# Patient Record
Sex: Female | Born: 1975 | Race: White | Hispanic: No | Marital: Married | Smoking: Never smoker
Health system: Southern US, Community
[De-identification: ages and names within clinical notes are randomized; demographics above are authoritative.]

## PROBLEM LIST (undated history)

## (undated) DIAGNOSIS — G40909 Epilepsy, unspecified, not intractable, without status epilepticus: Secondary | ICD-10-CM

## (undated) DIAGNOSIS — J45909 Unspecified asthma, uncomplicated: Secondary | ICD-10-CM

## (undated) DIAGNOSIS — G5600 Carpal tunnel syndrome, unspecified upper limb: Secondary | ICD-10-CM

## (undated) HISTORY — PX: OTHER SURGICAL HISTORY: SHX169

## (undated) HISTORY — PX: TONSILLECTOMY: SUR1361

## (undated) HISTORY — DX: Epilepsy, unspecified, not intractable, without status epilepticus: G40.909

## (undated) HISTORY — DX: Carpal tunnel syndrome, unspecified upper limb: G56.00

---

## 2000-07-24 DIAGNOSIS — Z8774 Personal history of (corrected) congenital malformations of heart and circulatory system: Secondary | ICD-10-CM

## 2000-07-24 HISTORY — DX: Personal history of (corrected) congenital malformations of heart and circulatory system: Z87.74

## 2000-07-24 HISTORY — PX: OTHER SURGICAL HISTORY: SHX169

## 2015-07-03 ENCOUNTER — Emergency Department (HOSPITAL_COMMUNITY): Payer: 59

## 2015-07-03 ENCOUNTER — Emergency Department (HOSPITAL_COMMUNITY)
Admission: EM | Admit: 2015-07-03 | Discharge: 2015-07-03 | Disposition: A | Discharge status: Home / Self Care | Payer: 59 | Attending: Emergency Medicine | Admitting: Emergency Medicine

## 2015-07-03 ENCOUNTER — Encounter (HOSPITAL_COMMUNITY): Payer: Self-pay

## 2015-07-03 DIAGNOSIS — Z79899 Other long term (current) drug therapy: Secondary | ICD-10-CM | POA: Diagnosis not present

## 2015-07-03 DIAGNOSIS — R05 Cough: Secondary | ICD-10-CM | POA: Diagnosis present

## 2015-07-03 DIAGNOSIS — J45901 Unspecified asthma with (acute) exacerbation: Secondary | ICD-10-CM | POA: Insufficient documentation

## 2015-07-03 HISTORY — DX: Unspecified asthma, uncomplicated: J45.909

## 2015-07-03 MED ORDER — ALBUTEROL SULFATE (2.5 MG/3ML) 0.083% IN NEBU
5.0000 mg | INHALATION_SOLUTION | Freq: Once | RESPIRATORY_TRACT | Status: AC
  Administered 2015-07-03: 5 mg via RESPIRATORY_TRACT
  Filled 2015-07-03: qty 6

## 2015-07-03 MED ORDER — PREDNISONE 20 MG PO TABS: ORAL_TABLET | ORAL | Status: DC

## 2015-07-03 MED ORDER — PREDNISONE 10 MG PO TABS
60.0000 mg | ORAL_TABLET | Freq: Once | ORAL | Status: AC
  Administered 2015-07-03: 60 mg via ORAL
  Filled 2015-07-03: qty 1

## 2015-07-03 MED ORDER — IPRATROPIUM BROMIDE 0.02 % IN SOLN
0.5000 mg | Freq: Once | RESPIRATORY_TRACT | Status: AC
  Administered 2015-07-03: 0.5 mg via RESPIRATORY_TRACT
  Filled 2015-07-03: qty 2.5

## 2015-07-03 MED ORDER — AEROCHAMBER Z-STAT PLUS/MEDIUM MISC: 1.0000 | Freq: Once | Status: DC

## 2015-07-03 MED ORDER — ALBUTEROL SULFATE HFA 108 (90 BASE) MCG/ACT IN AERS: 2.0000 | INHALATION_SPRAY | RESPIRATORY_TRACT | Status: AC | PRN

## 2015-07-03 NOTE — ED Provider Notes (Signed)
CSN: [id number]     Arrival date & time 07/03/15  [id number] History   First MD Initiated Contact with Patient 07/03/15 0535    Chief Complaint  Patient presents with  . Wheezing     (Consider location/radiation/quality/duration/timing/severity/associated sxs/prior Treatment) HPI patient reports a history of asthma since she was a child. She relates her last flareup was about 3 years ago. She states she's never had to be admitted to the hospital. She states they usually do put her on steroids. She states last night she started feeling short of breath and she took her allergy medication which usually helps with her symptoms. However she woke up at 4 AM and felt short of breath and states her chest felt tight. She has had a mild cough for the past week with fever earlier in the week. She's had rhinorrhea that she described as white and clear. She denies any sore throat.   PCP [facility]  Past Medical History  Diagnosis Date  . Asthma    History reviewed. No pertinent past surgical history. No family history on file. Social History  Substance Use Topics  . Smoking status: Never Smoker   . Smokeless tobacco: None  . Alcohol Use: No   employed  OB History    No data available     Review of Systems  All other systems reviewed and are negative.     Allergies  Review of patient's allergies indicates no known allergies.  Home Medications   Prior to Admission medications   Medication Sig Start Date End Date Taking? Authorizing Provider  Multiple Vitamins-Minerals (MULTIVITAMIN WITH MINERALS) tablet Take 1 tablet by mouth daily.   Yes Historical Provider, MD  albuterol (PROVENTIL HFA;VENTOLIN HFA) 108 (90 BASE) MCG/ACT inhaler Inhale 2 puffs into the lungs every 4 (four) hours as needed. 07/03/15   Devoria AlbeIva Olimpia Tinch, MD  predniSONE (DELTASONE) 20 MG tablet Take 3 po QD x 3d , then 2 po QD x 3d then 1 po QD x 3d 07/03/15   Devoria AlbeIva Namrata Dangler, MD   BP 142/97 mmHg  Pulse 88  Temp(Src) 97.8 F  (36.6 C) (Oral)  Resp 22  Ht 5' 4.5" (1.638 m)  Wt 166 lb (75.297 kg)  BMI 28.06 kg/m2  SpO2 90%  LMP 07/02/2015  Vital signs normal   Physical Exam  Constitutional: She is oriented to person, place, and time. She appears well-developed and well-nourished.  Non-toxic appearance. She does not appear ill. No distress.  HENT:  Head: Normocephalic and atraumatic.  Right Ear: External ear normal.  Left Ear: External ear normal.  Nose: Nose normal. No mucosal edema or rhinorrhea.  Mouth/Throat: Oropharynx is clear and moist and mucous membranes are normal. No dental abscesses or uvula swelling.  Eyes: Conjunctivae and EOM are normal. Pupils are equal, round, and reactive to light.  Neck: Normal range of motion and full passive range of motion without pain. Neck supple.  Cardiovascular: Normal rate, regular rhythm and normal heart sounds.  Exam reveals no gallop and no friction rub.   No murmur heard. Pulmonary/Chest: Effort normal. No respiratory distress. She has wheezes. She has no rhonchi. She has no rales. She exhibits no tenderness and no crepitus.  Patient is having and expiratory wheezing diffusely.  Abdominal: Soft. Normal appearance and bowel sounds are normal. She exhibits no distension. There is no tenderness. There is no rebound and no guarding.  Musculoskeletal: Normal range of motion. She exhibits no edema or tenderness.  Moves all extremities well.  Neurological: She is alert and oriented to person, place, and time. She has normal strength. No cranial nerve deficit.  Skin: Skin is warm, dry and intact. No rash noted. No erythema. No pallor.  Psychiatric: She has a normal mood and affect. Her speech is normal and behavior is normal. Her mood appears not anxious.  Nursing note and vitals reviewed.   ED Course  Procedures (including critical care time)  Medications  aerochamber Z-Stat Plus/medium 1 each (not administered)  albuterol (PROVENTIL) (2.5 MG/3ML) 0.083%  nebulizer solution 5 mg (5 mg Nebulization Given 07/03/15 0553)  ipratropium (ATROVENT) nebulizer solution 0.5 mg (0.5 mg Nebulization Given 07/03/15 0553)  predniSONE (DELTASONE) tablet 60 mg (60 mg Oral Given 07/03/15 0554)   Patient was given one albuterol/Atrovent nebulizer treatment with prednisone 60 mg orally.  Patient was rechecked at 6:50 AM. Her lungs are improved with improved air movement and no wheezing at this point. Patient states she's feeling much improved. We discussed her cough however she is only had clear or white sputum and she states she only had a low-grade fever earlier in the week. We decided to not use steroids. She states normally the inhaler and the prednisone will improve her symptoms.    Imaging Review Dg Chest 2 View  07/03/2015  CLINICAL DATA:  Acute onset of wheezing and shortness of breath. Cough, body aches and intermittent fever. Initial encounter. EXAM: CHEST  2 VIEW COMPARISON:  None. FINDINGS: The lungs are well-aerated and clear. There is no evidence of focal opacification, pleural effusion or pneumothorax. The heart is normal in size; the mediastinal contour is within normal limits. Postoperative change is noted at the superior mediastinum. No acute osseous abnormalities are seen. IMPRESSION: No acute cardiopulmonary process seen. Electronically Signed   By: Roanna Raider M.D.   On: 07/03/2015 06:33   I have personally reviewed and evaluated these images and lab results as part of my medical decision-making.   EKG Interpretation None      MDM   Final diagnoses:  Exacerbation of asthma    New Prescriptions   ALBUTEROL (PROVENTIL HFA;VENTOLIN HFA) 108 (90 BASE) MCG/ACT INHALER    Inhale 2 puffs into the lungs every 4 (four) hours as needed.   PREDNISONE (DELTASONE) 20 MG TABLET    Take 3 po QD x 3d , then 2 po QD x 3d then 1 po QD x 3d    Plan discharge  Devoria Albe, MD, Concha Pyo, MD 07/03/15 0700

## 2015-07-03 NOTE — ED Notes (Signed)
Wheezing onset yesterday, states has gotten worse overnight, has been out of her inhaler.

## 2015-07-03 NOTE — Discharge Instructions (Signed)
Use the inhaler for wheezing and shortness of breath. Take the prednisone until gone. Recheck if you struggle to breathe despite using the inhaler, get a fever or seem worse.    Metered Dose Inhaler With Spacer Inhaled medicines are the basis of treatment of asthma and other breathing problems. Inhaled medicine can only be effective if used properly. Good technique assures that the medicine reaches the lungs. Your health care provider has asked you to use a spacer with your inhaler to help you take the medicine more effectively. A spacer is a plastic tube with a mouthpiece on one end and an opening that connects to the inhaler on the other end. Metered dose inhalers (MDIs) are used to deliver a variety of inhaled medicines. These include quick relief or rescue medicines (such as bronchodilators) and controller medicines (such as corticosteroids). The medicine is delivered by pushing down on a metal canister to release a set amount of spray. If you are using different kinds of inhalers, use your quick relief medicine to open the airways 10-15 minutes before using a steroid if instructed to do so by your health care provider. If you are unsure which inhalers to use and the order of using them, ask your health care provider, nurse, or respiratory therapist. HOW TO USE THE INHALER WITH A SPACER  Remove cap from inhaler.  If you are using the inhaler for the first time, you will need to prime it. Shake the inhaler for 5 seconds and release four puffs into the air, away from your face. Ask your health care provider or pharmacist if you have questions about priming your inhaler.  Shake inhaler for 5 seconds before each breath in (inhalation).  Place the open end of the spacer onto the mouthpiece of the inhaler.  Position the inhaler so that the top of the canister faces up and the spacer mouthpiece faces you.  Put your index finger on the top of the medicine canister. Your thumb supports the bottom of  the inhaler and the spacer.  Breathe out (exhale) normally and as completely as possible.  Immediately after exhaling, place the spacer between your teeth and into your mouth. Close your mouth tightly around the spacer.  Press the canister down with the index finger to release the medicine.  At the same time as the canister is pressed, inhale deeply and slowly until the lungs are completely filled. This should take 4-6 seconds. Keep your tongue down and out of the way.  Hold the medicine in your lungs for 5-10 seconds (10 seconds is best). This helps the medicine get into the small airways of your lungs. Exhale.  Repeat inhaling deeply through the spacer mouthpiece. Again hold that breath for up to 10 seconds (10 seconds is best). Exhale slowly. If it is difficult to take this second deep breath through the spacer, breathe normally several times through the spacer. Remove the spacer from your mouth.  Wait at least 15-30 seconds between puffs. Continue with the above steps until you have taken the number of puffs your health care provider has ordered. Do not use the inhaler more than your health care provider directs you to.  Remove spacer from the inhaler and place cap on inhaler.  Follow the directions from your health care provider or the inhaler insert for cleaning the inhaler and spacer. If you are using a steroid inhaler, rinse your mouth with water after your last puff, gargle, and spit out the water. Do not swallow the water.  AVOID:  Inhaling before or after starting the spray of medicine. It takes practice to coordinate your breathing with triggering the spray.  Inhaling through the nose (rather than the mouth) when triggering the spray. HOW TO DETERMINE IF YOUR INHALER IS FULL OR NEARLY EMPTY You cannot know when an inhaler is empty by shaking it. A few inhalers are now being made with dose counters. Ask your health care provider for a prescription that has a dose counter if you  feel you need that extra help. If your inhaler does not have a counter, ask your health care provider to help you determine the date you need to refill your inhaler. Write the refill date on a calendar or your inhaler canister. Refill your inhaler 7-10 days before it runs out. Be sure to keep an adequate supply of medicine. This includes making sure it is not expired, and you have a spare inhaler.  SEEK MEDICAL CARE IF:   Symptoms are only partially relieved with your inhaler.  You are having trouble using your inhaler.  You experience some increase in phlegm. SEEK IMMEDIATE MEDICAL CARE IF:   You feel little or no relief with your inhalers. You are still wheezing and are feeling shortness of breath or tightness in your chest or both.  You have dizziness, headaches, or fast heart rate.  You have chills, fever, or night sweats.  There is a noticeable increase in phlegm production, or there is blood in the phlegm.   This information is not intended to replace advice given to you by your health care provider. Make sure you discuss any questions you have with your health care provider.   Document Released: 07/10/2005 Document Revised: 11/24/2014 Document Reviewed: 12/26/2012 Elsevier Interactive Patient Education 2016 Elsevier Inc.  Asthma, Acute Bronchospasm Acute bronchospasm caused by asthma is also referred to as an asthma attack. Bronchospasm means your air passages become narrowed. The narrowing is caused by inflammation and tightening of the muscles in the air tubes (bronchi) in your lungs. This can make it hard to breathe or cause you to wheeze and cough. CAUSES Possible triggers are:  Animal dander from the skin, hair, or feathers of animals.  Dust mites contained in house dust.  Cockroaches.  Pollen from trees or grass.  Mold.  Cigarette or tobacco smoke.  Air pollutants such as dust, household cleaners, hair sprays, aerosol sprays, paint fumes, strong chemicals, or  strong odors.  Cold air or weather changes. Cold air may trigger inflammation. Winds increase molds and pollens in the air.  Strong emotions such as crying or laughing hard.  Stress.  Certain medicines such as aspirin or beta-blockers.  Sulfites in foods and drinks, such as dried fruits and wine.  Infections or inflammatory conditions, such as a flu, cold, or inflammation of the nasal membranes (rhinitis).  Gastroesophageal reflux disease (GERD). GERD is a condition where stomach acid backs up into your esophagus.  Exercise or strenuous activity. SIGNS AND SYMPTOMS   Wheezing.  Excessive coughing, particularly at night.  Chest tightness.  Shortness of breath. DIAGNOSIS  Your health care provider will ask you about your medical history and perform a physical exam. A chest X-ray or blood testing may be performed to look for other causes of your symptoms or other conditions that may have triggered your asthma attack. TREATMENT  Treatment is aimed at reducing inflammation and opening up the airways in your lungs. Most asthma attacks are treated with inhaled medicines. These include quick relief or rescue medicines (  such as bronchodilators) and controller medicines (such as inhaled corticosteroids). These medicines are sometimes given through an inhaler or a nebulizer. Systemic steroid medicine taken by mouth or given through an IV tube also can be used to reduce the inflammation when an attack is moderate or severe. Antibiotic medicines are only used if a bacterial infection is present.  HOME CARE INSTRUCTIONS   Rest.  Drink plenty of liquids. This helps the mucus to remain thin and be easily coughed up. Only use caffeine in moderation and do not use alcohol until you have recovered from your illness.  Do not smoke. Avoid being exposed to secondhand smoke.  You play a critical role in keeping yourself in good health. Avoid exposure to things that cause you to wheeze or to have  breathing problems.  Keep your medicines up-to-date and available. Carefully follow your health care provider's treatment plan.  Take your medicine exactly as prescribed.  When pollen or pollution is bad, keep windows closed and use an air conditioner or go to places with air conditioning.  Asthma requires careful medical care. See your health care provider for a follow-up as advised. If you are more than [redacted] weeks pregnant and you were prescribed any new medicines, let your obstetrician know about the visit and how you are doing. Follow up with your health care provider as directed.  After you have recovered from your asthma attack, make an appointment with your outpatient doctor to talk about ways to reduce the likelihood of future attacks. If you do not have a doctor who manages your asthma, make an appointment with a primary care doctor to discuss your asthma. SEEK IMMEDIATE MEDICAL CARE IF:   You are getting worse.  You have trouble breathing. If severe, call your local emergency services (911 in the [place]  You develop chest pain or discomfort.  You are vomiting.  You are not able to keep fluids down.  You are coughing up yellow, green, brown, or bloody sputum.  You have a fever and your symptoms suddenly get worse.  You have trouble swallowing. MAKE SURE YOU:   Understand these instructions.  Will watch your condition.  Will get help right away if you are not doing well or get worse.   This information is not intended to replace advice given to you by your health care provider. Make sure you discuss any questions you have with your health care provider.   Document Released: 10/25/2006 Document Revised: 07/15/2013 Document Reviewed: 01/15/2013 Elsevier Interactive Patient Education [employer]

## 2015-07-03 NOTE — Progress Notes (Signed)
**Note De-Identified Faithann Natal Obfuscation** Patient is asthmatic and has prescription for MDI; RT instructed patient on spacer.

## 2016-04-27 ENCOUNTER — Other Ambulatory Visit: Payer: Self-pay | Admitting: Family Medicine

## 2016-04-27 DIAGNOSIS — Z1231 Encounter for screening mammogram for malignant neoplasm of breast: Secondary | ICD-10-CM

## 2019-07-01 ENCOUNTER — Other Ambulatory Visit (HOSPITAL_COMMUNITY): Payer: Self-pay | Admitting: Family Medicine

## 2019-07-01 DIAGNOSIS — Z1231 Encounter for screening mammogram for malignant neoplasm of breast: Secondary | ICD-10-CM

## 2019-10-01 ENCOUNTER — Other Ambulatory Visit: Payer: Self-pay

## 2019-10-01 ENCOUNTER — Ambulatory Visit (HOSPITAL_COMMUNITY)
Admission: RE | Admit: 2019-10-01 | Discharge: 2019-10-01 | Disposition: A | Discharge status: Home / Self Care | Payer: BC Managed Care – PPO | Source: Ambulatory Visit | Attending: Family Medicine | Admitting: Family Medicine

## 2019-10-01 DIAGNOSIS — Z1231 Encounter for screening mammogram for malignant neoplasm of breast: Secondary | ICD-10-CM | POA: Diagnosis present

## 2019-10-02 ENCOUNTER — Other Ambulatory Visit (HOSPITAL_COMMUNITY): Payer: Self-pay | Admitting: Family Medicine

## 2019-10-02 DIAGNOSIS — R928 Other abnormal and inconclusive findings on diagnostic imaging of breast: Secondary | ICD-10-CM

## 2019-10-07 ENCOUNTER — Telehealth: Payer: Self-pay | Admitting: Adult Health

## 2019-10-07 NOTE — Telephone Encounter (Signed)

## 2019-10-08 ENCOUNTER — Encounter: Payer: Self-pay | Admitting: Adult Health

## 2019-10-08 ENCOUNTER — Other Ambulatory Visit: Payer: Self-pay

## 2019-10-08 ENCOUNTER — Ambulatory Visit (INDEPENDENT_AMBULATORY_CARE_PROVIDER_SITE_OTHER): Payer: BC Managed Care – PPO | Admitting: Adult Health

## 2019-10-08 VITALS — BP 121/77 | HR 107 | Ht 64.0 in | Wt 158.4 lb

## 2019-10-08 DIAGNOSIS — N951 Menopausal and female climacteric states: Secondary | ICD-10-CM | POA: Diagnosis not present

## 2019-10-08 DIAGNOSIS — R61 Generalized hyperhidrosis: Secondary | ICD-10-CM

## 2019-10-08 DIAGNOSIS — N926 Irregular menstruation, unspecified: Secondary | ICD-10-CM | POA: Diagnosis not present

## 2019-10-08 DIAGNOSIS — N921 Excessive and frequent menstruation with irregular cycle: Secondary | ICD-10-CM

## 2019-10-08 DIAGNOSIS — N949 Unspecified condition associated with female genital organs and menstrual cycle: Secondary | ICD-10-CM

## 2019-10-08 LAB — POCT HEMOGLOBIN: Hemoglobin: 14 g/dL (ref 11–14.6)

## 2019-10-08 NOTE — Patient Instructions (Signed)

## 2019-10-08 NOTE — Progress Notes (Signed)
Patient ID: Melissa Skinner, female   DOB: July 21, 1976, 44 y.o.   MRN: [id number] History of Present Illness:  Melissa Skinner is a 44 year old white female,married, G3P2, in for irregular bleeding. Has been bleeding this time since 2/28, light. 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  PCP is Terie Purser PA.  Current Medications, Allergies, Past Medical History, Past Surgical History, Family History and Social History were reviewed in [employer] record.     Review of Systems: For last several cycles, will bleed 2-3 weeks, is light, some pressure and discomfort +night sweats No pain with sex, uses condoms  Had cramps last year and increased calcium and magnesium and is better   Physical Exam:BP 121/77 (BP Location: Left Arm, Patient Position: Sitting, Cuff Size: Normal)   Pulse (!) 107   Ht 5\' 4"  (1.626 m)   Wt 158 lb 6.4 oz (71.8 kg)   LMP 09/21/2019   BMI 27.19 kg/m HGB 14 General:  Well developed, well nourished, no acute distress Skin:  Warm and dry Lungs; Clear to auscultation bilaterally Cardiovascular: Regular rate and rhythm Pelvic:  External genitalia is normal in appearance, no lesions.  The vagina is normal in appearance,+[ink blood, no odor. Urethra has no lesions or masses. The cervix is smooth,but nabothian cyst at 12 0'clock.  Uterus is felt to be ULNS and mildly tender.  No adnexal masses,+LLQ tenderness noted.Bladder is non tender, no masses felt. Extremities/musculoskeletal:  No swelling or varicosities noted, no clubbing or cyanosis Psych:  No mood changes, alert and cooperative,seems happy Fall risk is low PHQ 2 score is 0. Examination chaperoned by 09/23/2019 CMA.  Impression and Plan: 1. Menorrhagia with irregular cycle  2. Night sweats  3. Perimenopause Discussed peri menopause  Review handout   4. Tenderness of uterus GYN Ishmael Holter in 6 days   5. Irregular bleeding GYN [place] to assess uterus and ovaries  Will  talk when [place] back about options, did touch on HRT today

## 2019-10-13 ENCOUNTER — Telehealth: Payer: Self-pay | Admitting: Obstetrics & Gynecology

## 2019-10-13 NOTE — Telephone Encounter (Signed)

## 2019-10-14 ENCOUNTER — Ambulatory Visit (INDEPENDENT_AMBULATORY_CARE_PROVIDER_SITE_OTHER): Payer: BC Managed Care – PPO

## 2019-10-14 ENCOUNTER — Other Ambulatory Visit: Payer: Self-pay

## 2019-10-14 ENCOUNTER — Ambulatory Visit (HOSPITAL_COMMUNITY)
Admission: RE | Admit: 2019-10-14 | Discharge: 2019-10-14 | Disposition: A | Discharge status: Home / Self Care | Payer: BC Managed Care – PPO | Source: Ambulatory Visit | Attending: Family Medicine | Admitting: Family Medicine

## 2019-10-14 DIAGNOSIS — R928 Other abnormal and inconclusive findings on diagnostic imaging of breast: Secondary | ICD-10-CM

## 2019-10-14 DIAGNOSIS — N840 Polyp of corpus uteri: Secondary | ICD-10-CM

## 2019-10-14 DIAGNOSIS — N949 Unspecified condition associated with female genital organs and menstrual cycle: Secondary | ICD-10-CM

## 2019-10-14 DIAGNOSIS — N926 Irregular menstruation, unspecified: Secondary | ICD-10-CM

## 2019-10-14 NOTE — Progress Notes (Signed)
PELVIC US TA/TV: homogeneous anteverted uterus,wnl,EEC 7.3 mm,echogenic endometrial mass with feeder vessel 12 x 9 x 6 mm,normal ovaries,ovaries appear mobile,no free fluid,no pain during ultrasound  Chaperone Peggy

## 2019-10-16 ENCOUNTER — Telehealth: Payer: Self-pay | Admitting: Adult Health

## 2019-10-16 DIAGNOSIS — N926 Irregular menstruation, unspecified: Secondary | ICD-10-CM

## 2019-10-16 NOTE — Telephone Encounter (Signed)
Left message to call about US  

## 2019-10-16 NOTE — Telephone Encounter (Signed)
Melissa Skinner is made aware that US showed endometrial polyp and Dr Emelda Fear said it needs to be removed, will make appt with him  The uterus and ovaries looked normal

## 2019-10-22 ENCOUNTER — Telehealth: Payer: Self-pay | Admitting: Obstetrics and Gynecology

## 2019-10-22 NOTE — Telephone Encounter (Signed)

## 2019-10-27 ENCOUNTER — Other Ambulatory Visit: Payer: Self-pay

## 2019-10-27 ENCOUNTER — Ambulatory Visit (INDEPENDENT_AMBULATORY_CARE_PROVIDER_SITE_OTHER): Payer: BC Managed Care – PPO | Admitting: Obstetrics and Gynecology

## 2019-10-27 ENCOUNTER — Encounter: Payer: Self-pay | Admitting: Obstetrics and Gynecology

## 2019-10-27 VITALS — BP 135/87 | HR 78 | Ht 64.2 in | Wt 161.6 lb

## 2019-10-27 DIAGNOSIS — N840 Polyp of corpus uteri: Secondary | ICD-10-CM | POA: Diagnosis not present

## 2019-10-27 DIAGNOSIS — Z3202 Encounter for pregnancy test, result negative: Secondary | ICD-10-CM | POA: Diagnosis not present

## 2019-10-27 LAB — POCT URINE PREGNANCY: Preg Test, Ur: NEGATIVE

## 2019-10-27 MED ORDER — NORETHIN ACE-ETH ESTRAD-FE 1-20 MG-MCG(24) PO TABS: 1.0000 | ORAL_TABLET | Freq: Every day | ORAL | 3 refills | Status: DC

## 2019-10-27 NOTE — Progress Notes (Signed)
Patient ID: Melissa Skinner, female   DOB: 03-Jun-1976, 44 y.o.   MRN: [id number]    [facility] Visit  10/27/19     Patient name: Melissa Skinner MRN [id number]  Date of birth: [birth year]  CC & HPI:  Melissa Skinner is a 44 y.o. female presenting today for probable endometrial polyp discussion.   She notes that her period would be extremely heavy for 5-6 days, and then it would last around 14 days. She continues to have spotting.   10/14/2019 TV pelvic ultrasound revealed homogeneous anteverted uterus, wnl, EEC 7.3 mm, echogenic endometrial mass with feeder vessel 12 x 9 x 6 mm, normal ovaries, ovaries appear mobile, no free fluid, no pain during ultrasound.   Last PAP was December 2020 with PCP.   Melissa Skinner notes that she has no intention of having anymore children. Her current birth control method is condoms.   She notes that she is having significant hot flashes; these seem to have some correlation to her menstrual cycle, but she is unsure. She is having increased fatigue and more difficulty losing weight.   ROS:  Review of Systems  Constitutional: Positive for malaise/fatigue (mild). Negative for diaphoresis, fever and weight loss.  HENT: Negative for congestion and sore throat.   Respiratory: Negative for cough and shortness of breath.   Cardiovascular: Negative for chest pain, palpitations and leg swelling.  Gastrointestinal: Negative for constipation, diarrhea, nausea and vomiting.  Genitourinary: Negative for frequency and urgency.  Musculoskeletal: Negative for myalgias.  Skin: Negative for rash.  Neurological: Negative for dizziness, weakness and headaches.  Psychiatric/Behavioral: Negative for depression. The patient is not nervous/anxious.      Pertinent History Reviewed:   Reviewed: Last PAP December 2020 Medical         Past Medical History:  Diagnosis Date  . Asthma                               Surgical Hx:   No past surgical history on file. Medications: Reviewed &  Updated - see associated section                       Current Outpatient Medications:  .  albuterol (PROVENTIL HFA;VENTOLIN HFA) 108 (90 BASE) MCG/ACT inhaler, Inhale 2 puffs into the lungs every 4 (four) hours as needed., Disp: 6.7 g, Rfl: 0 .  Multiple Vitamins-Minerals (MULTIVITAMIN WITH MINERALS) tablet, Take 1 tablet by mouth daily., Disp: , Rfl:    Social History: Reviewed -  reports that she has never smoked. She has never used smokeless tobacco. She does not have any known family history of breast cancer.   Objective Findings:  Vitals: There were no vitals taken for this visit.  PHYSICAL EXAMINATION General appearance - alert, well appearing, and in no distress, oriented to person, place, and time, normal appearing weight and well hydrated Mental status - alert, oriented to person, place, and time, normal mood, behavior, speech, dress, motor activity, and thought processes, affect appropriate to mood Chest - not examined Heart - not examined Abdomen - not examined Breasts - not examined Skin - normal coloration and turgor, no rashes, no suspicious skin lesions noted  PELVIC External genitalia - normal  Vulva - normal  Vagina - good support Cervix - Multiparous, well supported, anteverted, anteflexed.  Uterus - well supported. No fullness in ovary area. Adnexa - normal  Wet [place] - n/a Rectal -  Assessment & Plan:   Assessment:  1.   Endometrial Ablation Discussion: Discussed with pt risks and benefits of endometrial ablation. Advised pt that intrauterine pregnancy is nearly 100% preventable with ablation. Discussed reduced risk of ectopic pregnancy and reduced risk of ovarian cancer from 1 in 100 to 1 in 300 with bilateral salpingectomy in a lengthy conversation with risks benefits rationale and alternatives including IUD reviewed. Brochures given.   At end of discussion, pt had opportunity to ask questions and has no further questions at this time.   Specific  discussion of endometrial ablation and bilateral salpingectomy as noted above. Greater than 50% was spent in counseling and coordination of care with the patient.   Total time greater than: 10 minutes.     2.   Tubal Ligation & Salpingectomy Discussion  Discussed with pt risks and benefits of BTL. Discussed using clips only vs bilateral salpingectomy. Advised pt that bilateral salpingectomy is a permanent procedure, but reduces the risk of cancer by 2/3.  At end of discussion, pt had opportunity to ask questions and has no further questions at this time.   Specific discussion of permanent sterilization as noted above. Greater than 50% was spent in counseling and coordination of care with the patient.   Total time greater than: 5 minutes.    3. Birth Control Discussion  Birth control options discussed: Reviewed all forms of birth control options available including abstinence; over the counter/barrier methods; depo, nuva ring, Nexplanon, IUD, OCP and patch.   Risks/benefits/side effects of each discussed. Questions were answered.    Plan:  1. Rx Lo Loestrin 1:20  2. Follow up in 3 months via telehealth    By signing my name below, I, [employer], attest that this documentation has been prepared under the direction and in the presence of Tilda Burrow, MD. Electronically Signed: Mal Misty Medical Scribe. 10/27/19. 9:24 AM.  I personally performed the services described in this documentation, which was SCRIBED in my presence. The recorded information has been reviewed and considered accurate. It has been edited as necessary during review. Tilda Burrow, MD

## 2020-01-28 ENCOUNTER — Telehealth: Payer: BC Managed Care – PPO | Admitting: Obstetrics and Gynecology

## 2020-02-04 ENCOUNTER — Encounter: Payer: Self-pay | Admitting: Obstetrics and Gynecology

## 2020-02-04 ENCOUNTER — Ambulatory Visit (INDEPENDENT_AMBULATORY_CARE_PROVIDER_SITE_OTHER): Payer: BC Managed Care – PPO | Admitting: Obstetrics and Gynecology

## 2020-02-04 ENCOUNTER — Other Ambulatory Visit: Payer: Self-pay

## 2020-02-04 VITALS — BP 146/86 | HR 70 | Ht 65.0 in | Wt 160.0 lb

## 2020-02-04 DIAGNOSIS — N921 Excessive and frequent menstruation with irregular cycle: Secondary | ICD-10-CM | POA: Diagnosis not present

## 2020-02-04 DIAGNOSIS — N926 Irregular menstruation, unspecified: Secondary | ICD-10-CM

## 2020-02-04 NOTE — Progress Notes (Signed)
Patient ID: Melissa Skinner, female   DOB: [birth year], 44 y.o.   MRN: [id number]    [facility] Visit  @DATE @            Patient name: Melissa Skinner MRN Melissa Skinner  Date of birth: 12-20-1975  CC & HPI:  Melissa Skinner is a 44 y.o. female presenting today for a follow up for her heavy periods. She reports that her periods have been lighter and only last a few days since starting Loestrin.1/20 Her mood has improved and she has lost a little bit of weight. She does have a little bit of spotting every once in a while. She notes that she missed a pill and had heavy bleeding for a few days. The patient denies fever, chills or any other symptoms or complaints at this time.   ROS:  ROS  + occasional spotting + weight loss - fever - chills All systems are negative except as noted in the HPI and PMH.   Pertinent History Reviewed:   Reviewed: Medical         Past Medical History:  Diagnosis Date   Asthma    Carpal tunnel syndrome                               Surgical Hx:    Past Surgical History:  Procedure Laterality Date   heart catherization     TONSILLECTOMY     Medications: Reviewed & Updated - see associated section                       Current Outpatient Medications:    albuterol (PROVENTIL HFA;VENTOLIN HFA) 108 (90 BASE) MCG/ACT inhaler, Inhale 2 puffs into the lungs every 4 (four) hours as needed., Disp: 6.7 g, Rfl: 0   cetirizine (ZYRTEC) 10 MG tablet, Take 10 mg by mouth daily., Disp: , Rfl:    MAGNESIUM CL-CALCIUM CARBONATE PO, Take by mouth., Disp: , Rfl:    Multiple Vitamins-Minerals (MULTIVITAMIN WITH MINERALS) tablet, Take 1 tablet by mouth daily., Disp: , Rfl:    Norethindrone Acetate-Ethinyl Estrad-FE (LOESTRIN 24 FE) 1-20 MG-MCG(24) tablet, Take 1 tablet by mouth daily., Disp: 3 Package, Rfl: 3   Probiotic Product (PROBIOTIC-10 PO), Take by mouth., Disp: , Rfl:    Social History: Reviewed -  reports that she has never smoked. She has never used  smokeless tobacco.  Objective Findings:  Vitals: Blood pressure (!) 146/86, pulse 70, height 5\' 5"  (1.651 m), weight 160 lb (72.6 kg), last menstrual period 01/18/2020.  PHYSICAL EXAMINATION General appearance - alert, well appearing, and in no distress, oriented to person, place, and time and overweight Mental status - alert, oriented to person, place, and time, normal mood, behavior, speech, dress, motor activity, and thought processes, affect appropriate to mood  PELVIC DEFERRED  Assessment & Plan:   A:  1.  Menorrhagia, improved with Loestrin1/20   P:  1.  Refill Loestrin for 1 year 2.  F/U in 1 year  By signing my name below, I, , attest that this documentation has been prepared under the direction and in the presence of 01/20/2020, MD. Electronically Signed: Pietro Cassis, Medical Scribe. 02/04/20. 3:46 PM.  I personally performed the services described in this documentation, which was SCRIBED in my presence. The recorded information has been reviewed and considered accurate. It has been edited as necessary during review. Pietro Cassis, MD

## 2020-06-07 ENCOUNTER — Other Ambulatory Visit: Payer: BC Managed Care – PPO

## 2020-06-07 DIAGNOSIS — Z20822 Contact with and (suspected) exposure to covid-19: Secondary | ICD-10-CM

## 2020-06-08 LAB — NOVEL CORONAVIRUS, NAA: SARS-CoV-2, NAA: NOT DETECTED

## 2020-06-08 LAB — SARS-COV-2, NAA 2 DAY TAT

## 2020-06-22 ENCOUNTER — Encounter: Payer: Self-pay | Admitting: Adult Health

## 2020-06-22 ENCOUNTER — Other Ambulatory Visit: Payer: Self-pay

## 2020-06-22 ENCOUNTER — Ambulatory Visit: Payer: BC Managed Care – PPO | Admitting: Adult Health

## 2020-06-22 VITALS — BP 158/95 | HR 82 | Ht 65.0 in | Wt 155.0 lb

## 2020-06-22 DIAGNOSIS — Z3041 Encounter for surveillance of contraceptive pills: Secondary | ICD-10-CM | POA: Diagnosis not present

## 2020-06-22 DIAGNOSIS — I10 Essential (primary) hypertension: Secondary | ICD-10-CM

## 2020-06-22 DIAGNOSIS — N926 Irregular menstruation, unspecified: Secondary | ICD-10-CM | POA: Diagnosis not present

## 2020-06-22 MED ORDER — LO LOESTRIN FE 1 MG-10 MCG / 10 MCG PO TABS: 1.0000 | ORAL_TABLET | Freq: Every day | ORAL | 11 refills | Status: DC

## 2020-06-22 MED ORDER — HYDROCHLOROTHIAZIDE 12.5 MG PO CAPS: 12.5000 mg | ORAL_CAPSULE | Freq: Every day | ORAL | 3 refills | Status: DC

## 2020-06-22 NOTE — Progress Notes (Signed)
  Subjective:     Patient ID: Melissa Skinner, female   DOB: 15-Dec-1975, 44 y.o.   MRN: [id number]  HPI Melissa Skinner is a 44 year old white female,married, [phone] in complaining of having bleeding for 12 days last month with night sweats and moody, this last period was normal. She is seeing Dr Gerilyn Pilgrim and getting MRI in December, has been dizzy and had trouble with words and walking. PCP is Terie Purser PA.   Review of Systems  +irregular bleeding +night sweats +moody  Reviewed past medical,surgical, social and family history. Reviewed medications and allergies.     Objective:   Physical Exam BP (!) 158/95 (BP Location: Right Arm, Patient Position: Sitting, Cuff Size: Normal)   Pulse 82   Ht 5\' 5"  (1.651 m)   Wt 155 lb (70.3 kg)   LMP 06/14/2020   BMI 25.79 kg/m  Skin warm and dry.  Lungs: clear to ausculation bilaterally. Cardiovascular: regular rate and rhythm.   Fall risk is low  Upstream - 06/22/20 1636      Pregnancy Intention Screening   Does the patient want to become pregnant in the next year? No    Does the patient's partner want to become pregnant in the next year? No    Would the patient like to discuss contraceptive options today? No      Contraception Wrap Up   Current Method Oral Contraceptive    End Method Oral Contraceptive    Contraception Counseling Provided No          Assessment:     1. Irregular bleeding Will switch OCs to lo Loestrin given 3 packs, start today use condoms  Meds ordered this encounter  Medications  . hydrochlorothiazide (MICROZIDE) 12.5 MG capsule    Sig: Take 1 capsule (12.5 mg total) by mouth daily.    Dispense:  30 capsule    Refill:  3    Order Specific Question:   Supervising Provider    Answer:   06/24/20, LUTHER H [[id number]]  . Norethindrone-Ethinyl Estradiol-Fe Biphas (LO LOESTRIN FE) 1 MG-10 MCG / 10 MCG tablet    Sig: Take 1 tablet by mouth daily. Take 1 daily by mouth    Dispense:  28 tablet    Refill:  11    BIN Despina Hidden, PCN  CN, GRP [id number]    Order Specific Question:   Supervising Provider    Answer:   [id number] H [[id number]]    2. Hypertension, unspecified type Will rx Microzide   3. Encounter for surveillance of contraceptive pills     Plan:     Follow up in 8 weeks for BP check and ROS

## 2020-08-06 ENCOUNTER — Other Ambulatory Visit: Payer: Self-pay | Admitting: Neurology

## 2020-08-06 ENCOUNTER — Other Ambulatory Visit (HOSPITAL_COMMUNITY): Payer: BC Managed Care – PPO | Admitting: Neurology

## 2020-08-06 DIAGNOSIS — R569 Unspecified convulsions: Secondary | ICD-10-CM

## 2020-08-17 ENCOUNTER — Other Ambulatory Visit: Payer: Self-pay

## 2020-08-17 ENCOUNTER — Encounter: Payer: Self-pay | Admitting: Adult Health

## 2020-08-17 ENCOUNTER — Ambulatory Visit (INDEPENDENT_AMBULATORY_CARE_PROVIDER_SITE_OTHER): Payer: BC Managed Care – PPO | Admitting: Adult Health

## 2020-08-17 VITALS — BP 138/91 | HR 72 | Ht 65.0 in | Wt 153.0 lb

## 2020-08-17 DIAGNOSIS — N951 Menopausal and female climacteric states: Secondary | ICD-10-CM | POA: Diagnosis not present

## 2020-08-17 DIAGNOSIS — I10 Essential (primary) hypertension: Secondary | ICD-10-CM

## 2020-08-17 DIAGNOSIS — Z3041 Encounter for surveillance of contraceptive pills: Secondary | ICD-10-CM | POA: Diagnosis not present

## 2020-08-17 MED ORDER — HYDROCHLOROTHIAZIDE 12.5 MG PO CAPS: 12.5000 mg | ORAL_CAPSULE | Freq: Every day | ORAL | 6 refills | Status: DC

## 2020-08-17 MED ORDER — LO LOESTRIN FE 1 MG-10 MCG / 10 MCG PO TABS: 1.0000 | ORAL_TABLET | Freq: Every day | ORAL | 11 refills | Status: DC

## 2020-08-17 NOTE — Progress Notes (Signed)
  Subjective:     Patient ID: Melissa Skinner, female   DOB: 10-01-1975, 45 y.o.   MRN: [id number]  HPI Melissa Skinner is a 45 year old white female,married, [id number] back in follow up on starting lo Loestrin and Microzide.Periods are much lighter, no Hot flashes and not as moody. She has had EEG and had seizure after it and is on keppra now. PCP is Melissa Purser PA.  Review of Systems Periods lighter No hot flashes Not as moody Reviewed past medical,surgical, social and family history. Reviewed medications and allergies.     Objective:   Physical Exam BP (!) 138/91 (BP Location: Right Arm, Patient Position: Sitting, Cuff Size: Normal)   Pulse 72   Ht 5\' 5"  (1.651 m)   Wt 153 lb (69.4 kg)   LMP 08/17/2020   BMI 25.46 kg/m Skin warm and dry. Lungs: clear to ausculation bilaterally. Cardiovascular: regular rate and rhythm. Fall risk is low  Upstream - 08/17/20 1625      Pregnancy Intention Screening   Does the patient want to become pregnant in the next year? No    Does the patient's partner want to become pregnant in the next year? No    Would the patient like to discuss contraceptive options today? No      Contraception Wrap Up   Current Method Oral Contraceptive    End Method Oral Contraceptive    Contraception Counseling Provided No             Assessment:     1. Encounter for surveillance of contraceptive pills Continue lo loestrin, I gave 3 packs and discount card  Meds ordered this encounter  Medications  . Norethindrone-Ethinyl Estradiol-Fe Biphas (LO LOESTRIN FE) 1 MG-10 MCG / 10 MCG tablet    Sig: Take 1 tablet by mouth daily. Take 1 daily by mouth    Dispense:  28 tablet    Refill:  11    BIN 08/19/20, PCN CN, GRP [id number]    Order Specific Question:   Supervising Provider    Answer:   [id number] H [[id number]]  . hydrochlorothiazide (MICROZIDE) 12.5 MG capsule    Sig: Take 1 capsule (12.5 mg total) by mouth daily.    Dispense:  30 capsule    Refill:  6     Order Specific Question:   Supervising Provider    Answer:   Duane Lope, LUTHER H [[id number]]    2. Hypertension, unspecified type Continue Microzide   3. Perimenopause     Plan:     Follow up in 6 months

## 2020-08-30 ENCOUNTER — Ambulatory Visit (HOSPITAL_COMMUNITY): Payer: BC Managed Care – PPO

## 2020-08-30 ENCOUNTER — Encounter (HOSPITAL_COMMUNITY): Payer: Self-pay

## 2020-11-22 ENCOUNTER — Other Ambulatory Visit: Payer: Self-pay

## 2020-11-22 ENCOUNTER — Encounter (HOSPITAL_COMMUNITY): Payer: Self-pay

## 2020-11-22 ENCOUNTER — Ambulatory Visit (HOSPITAL_COMMUNITY)
Admission: RE | Admit: 2020-11-22 | Discharge: 2020-11-22 | Disposition: A | Discharge status: Home / Self Care | Payer: BC Managed Care – PPO | Source: Ambulatory Visit | Attending: Neurology | Admitting: Neurology

## 2020-11-22 ENCOUNTER — Ambulatory Visit (HOSPITAL_COMMUNITY): Payer: BC Managed Care – PPO

## 2020-11-22 DIAGNOSIS — R569 Unspecified convulsions: Secondary | ICD-10-CM

## 2020-12-01 ENCOUNTER — Ambulatory Visit (HOSPITAL_COMMUNITY): Admission: RE | Admit: 2020-12-01 | Payer: BC Managed Care – PPO | Source: Ambulatory Visit

## 2020-12-15 ENCOUNTER — Other Ambulatory Visit: Payer: Self-pay | Admitting: Adult Health

## 2021-02-24 ENCOUNTER — Ambulatory Visit (HOSPITAL_COMMUNITY)
Admission: RE | Admit: 2021-02-24 | Discharge: 2021-02-24 | Disposition: A | Discharge status: Home / Self Care | Payer: BC Managed Care – PPO | Source: Ambulatory Visit | Attending: Neurology | Admitting: Neurology

## 2021-02-24 ENCOUNTER — Other Ambulatory Visit: Payer: Self-pay

## 2021-02-24 DIAGNOSIS — R569 Unspecified convulsions: Secondary | ICD-10-CM | POA: Insufficient documentation

## 2021-02-24 MED ORDER — GADOBUTROL 1 MMOL/ML IV SOLN
7.0000 mL | Freq: Once | INTRAVENOUS | Status: AC | PRN
  Administered 2021-02-24: 7 mL via INTRAVENOUS

## 2021-08-13 ENCOUNTER — Other Ambulatory Visit: Payer: Self-pay | Admitting: Adult Health

## 2021-09-04 IMAGING — MG DIGITAL SCREENING BILAT W/ TOMO W/ CAD
8 series · 9 of 24 positions shown · non-contrast
Comparison: Previous exam(s).

CLINICAL DATA: Screening.

EXAM:
DIGITAL SCREENING BILATERAL MAMMOGRAM WITH TOMO AND CAD

[L CC synth-2D]
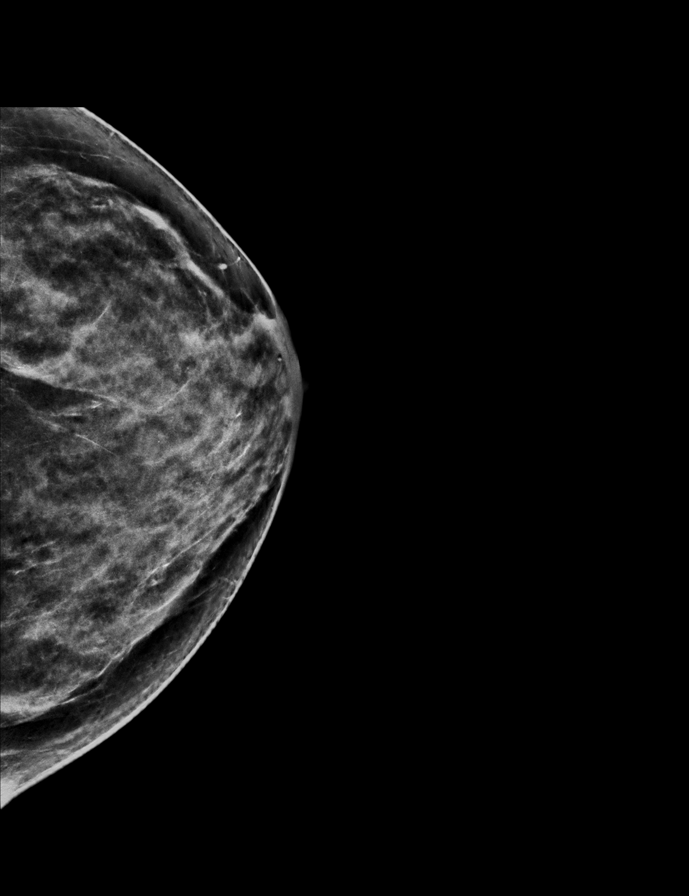

[R MLO synth-2D]
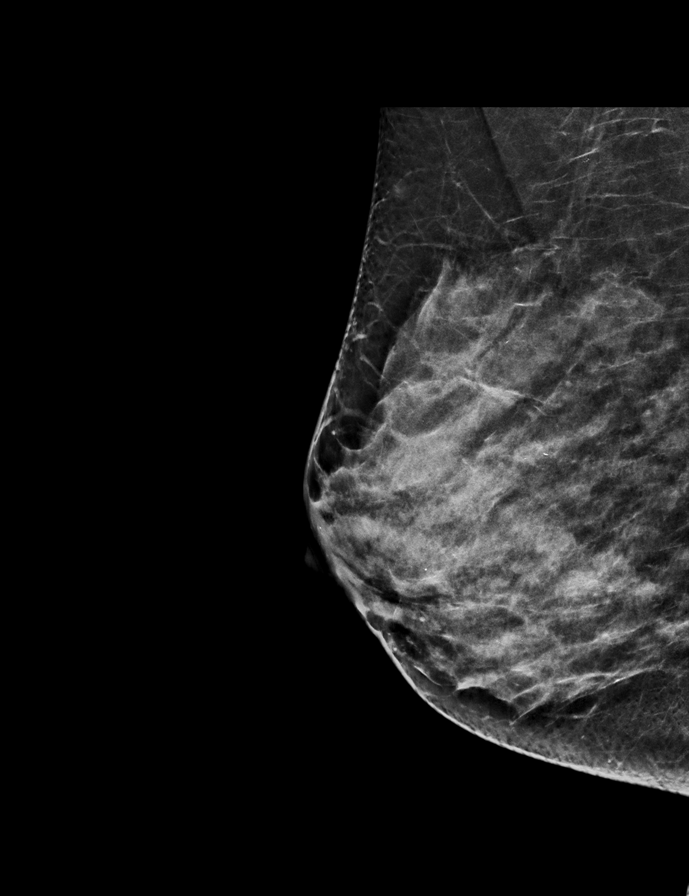

[L MLO synth-2D]
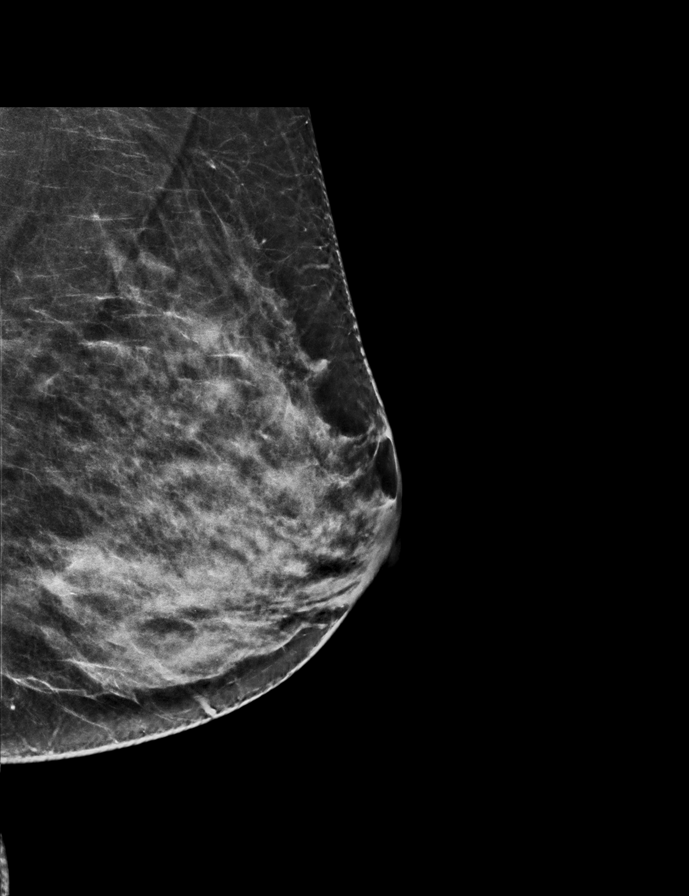

[R CC synth-2D]
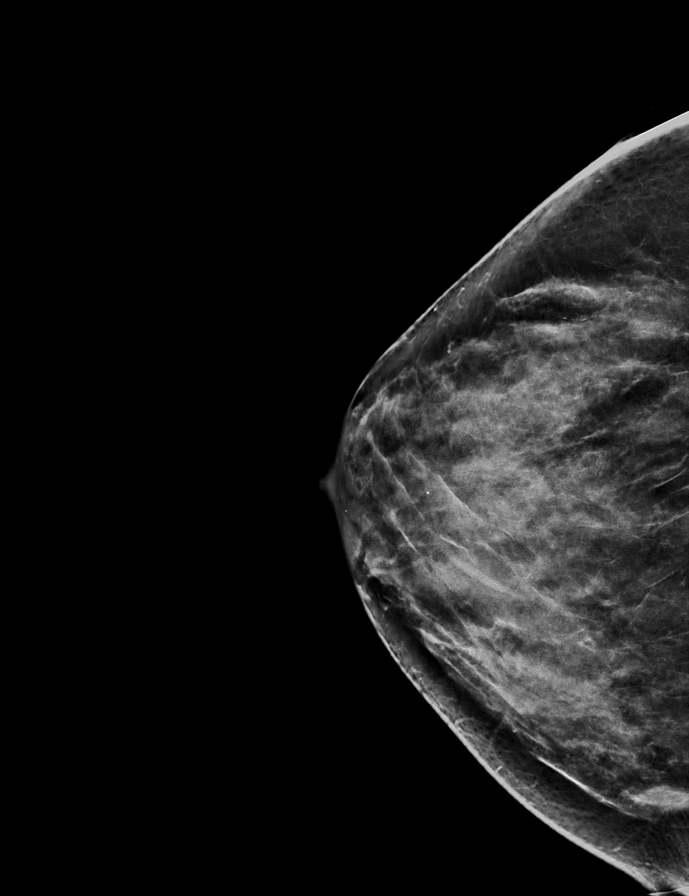

[L MLO tomo · 2 of 66 frames shown]
[frame 22/66]
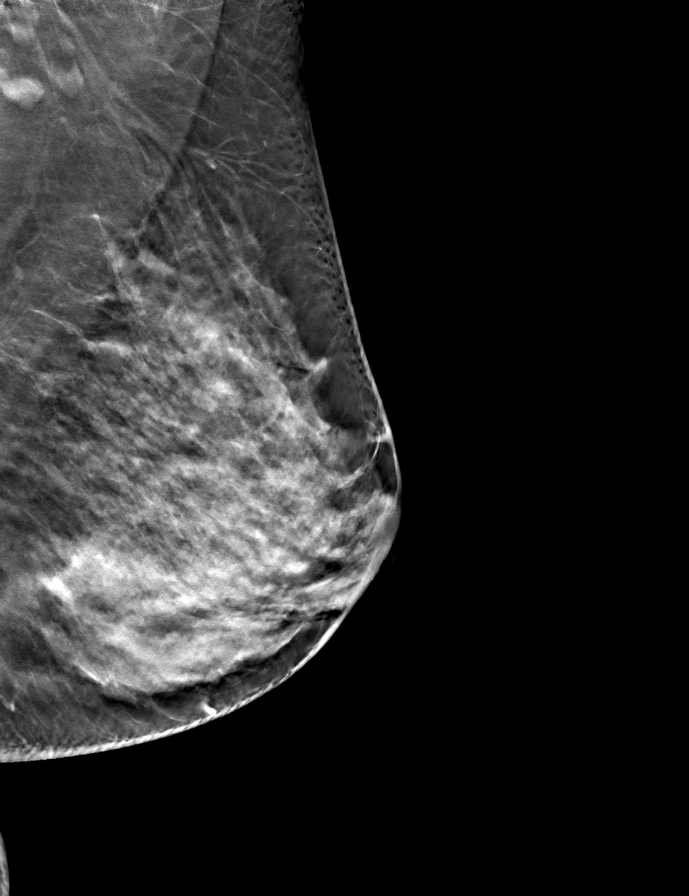
[frame 33/66]
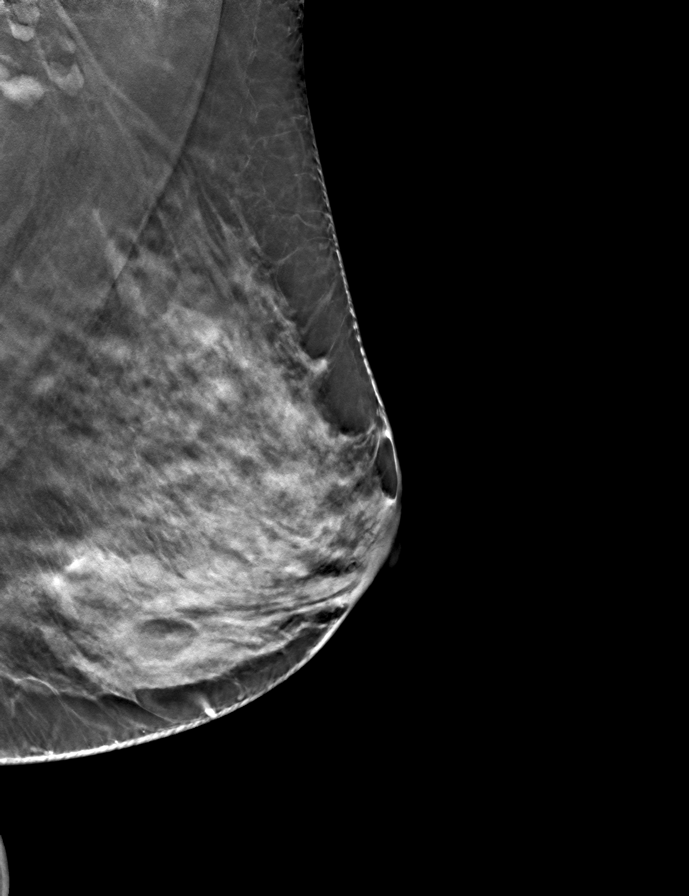

[L CC tomo · tomo slice 31/60.0]
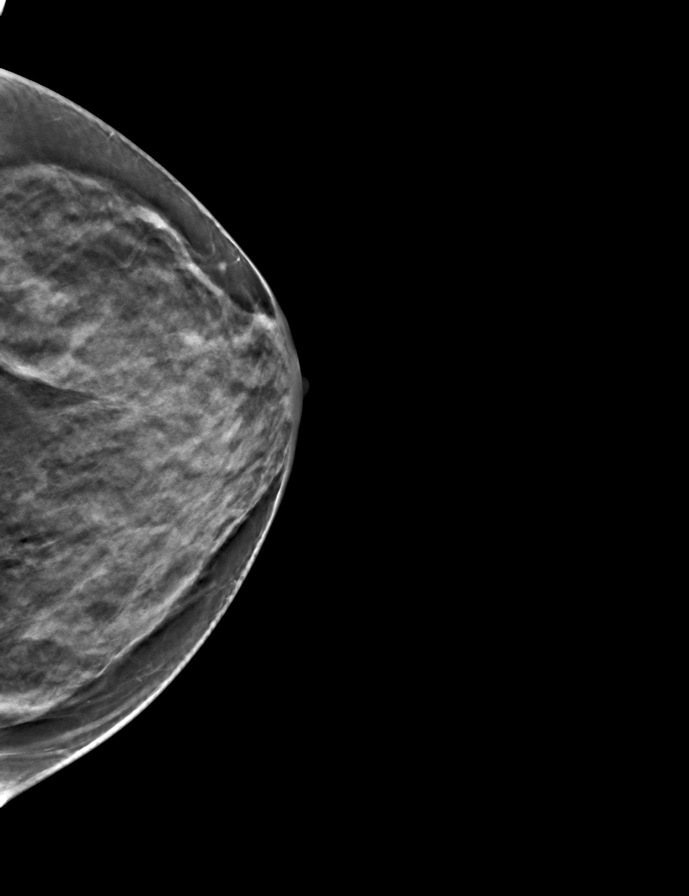

[R MLO tomo · tomo slice 31/61.0]
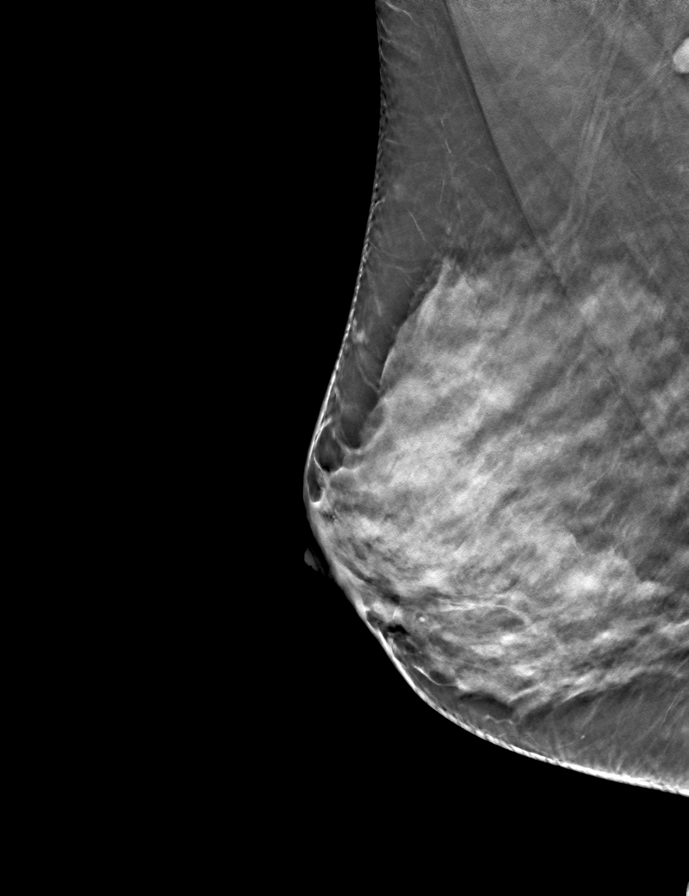

[R CC tomo · tomo slice 31/62.0]
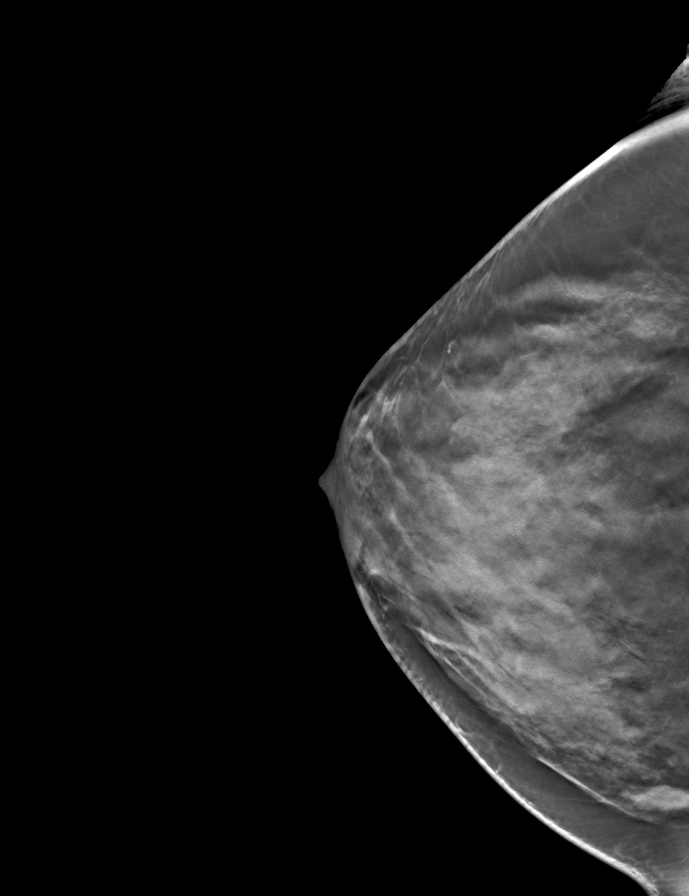

[9 of 24 positions shown; findings below may reference images not displayed]

ACR Breast Density Category c: The breast tissue is heterogeneously
dense, which may obscure small masses.
FINDINGS: In the right breast, a possible asymmetry warrants further
evaluation. In the left breast, no findings suspicious for
malignancy. Images were processed with CAD.
IMPRESSION: Further evaluation is suggested for possible asymmetry in the right
breast.

RECOMMENDATION:
Diagnostic mammogram and possibly ultrasound of the right breast.
(Code:EU-[id number])

The patient will be contacted regarding the findings, and additional
imaging will be scheduled.

BI-RADS CATEGORY  0: Incomplete. Need additional imaging evaluation
and/or prior mammograms for comparison.

## 2021-10-07 ENCOUNTER — Other Ambulatory Visit: Payer: Self-pay

## 2021-10-07 ENCOUNTER — Other Ambulatory Visit (HOSPITAL_COMMUNITY): Payer: Self-pay | Admitting: Family Medicine

## 2021-10-07 ENCOUNTER — Ambulatory Visit (HOSPITAL_COMMUNITY)
Admission: RE | Admit: 2021-10-07 | Discharge: 2021-10-07 | Disposition: A | Discharge status: Home / Self Care | Payer: BC Managed Care – PPO | Source: Ambulatory Visit | Attending: Family Medicine | Admitting: Family Medicine

## 2021-10-07 DIAGNOSIS — K5900 Constipation, unspecified: Secondary | ICD-10-CM | POA: Diagnosis not present

## 2021-10-07 DIAGNOSIS — I10 Essential (primary) hypertension: Secondary | ICD-10-CM | POA: Diagnosis not present

## 2021-10-07 DIAGNOSIS — Z20822 Contact with and (suspected) exposure to covid-19: Secondary | ICD-10-CM | POA: Diagnosis not present

## 2021-10-07 DIAGNOSIS — K810 Acute cholecystitis: Secondary | ICD-10-CM | POA: Diagnosis not present

## 2021-10-07 DIAGNOSIS — R1013 Epigastric pain: Secondary | ICD-10-CM

## 2021-10-07 DIAGNOSIS — E876 Hypokalemia: Secondary | ICD-10-CM | POA: Diagnosis not present

## 2021-10-07 DIAGNOSIS — R109 Unspecified abdominal pain: Secondary | ICD-10-CM | POA: Diagnosis present

## 2021-10-07 DIAGNOSIS — Z79899 Other long term (current) drug therapy: Secondary | ICD-10-CM | POA: Diagnosis not present

## 2021-10-07 DIAGNOSIS — N920 Excessive and frequent menstruation with regular cycle: Secondary | ICD-10-CM | POA: Diagnosis not present

## 2021-10-08 ENCOUNTER — Encounter (HOSPITAL_COMMUNITY): Payer: Self-pay | Admitting: *Deleted

## 2021-10-08 ENCOUNTER — Emergency Department (HOSPITAL_COMMUNITY): Payer: BC Managed Care – PPO

## 2021-10-08 ENCOUNTER — Other Ambulatory Visit: Payer: Self-pay

## 2021-10-08 ENCOUNTER — Observation Stay (HOSPITAL_COMMUNITY)
Admission: EM | Admit: 2021-10-08 | Discharge: 2021-10-11 | Disposition: A | Discharge status: Home / Self Care | Payer: BC Managed Care – PPO | Attending: General Surgery | Admitting: General Surgery

## 2021-10-08 DIAGNOSIS — Z833 Family history of diabetes mellitus: Secondary | ICD-10-CM

## 2021-10-08 DIAGNOSIS — Z91048 Other nonmedicinal substance allergy status: Secondary | ICD-10-CM | POA: Diagnosis not present

## 2021-10-08 DIAGNOSIS — E876 Hypokalemia: Secondary | ICD-10-CM | POA: Diagnosis present

## 2021-10-08 DIAGNOSIS — G40909 Epilepsy, unspecified, not intractable, without status epilepticus: Secondary | ICD-10-CM | POA: Diagnosis present

## 2021-10-08 DIAGNOSIS — Z8261 Family history of arthritis: Secondary | ICD-10-CM | POA: Diagnosis not present

## 2021-10-08 DIAGNOSIS — N921 Excessive and frequent menstruation with irregular cycle: Secondary | ICD-10-CM | POA: Diagnosis present

## 2021-10-08 DIAGNOSIS — J4520 Mild intermittent asthma, uncomplicated: Secondary | ICD-10-CM | POA: Diagnosis present

## 2021-10-08 DIAGNOSIS — R569 Unspecified convulsions: Secondary | ICD-10-CM

## 2021-10-08 DIAGNOSIS — Z20822 Contact with and (suspected) exposure to covid-19: Secondary | ICD-10-CM | POA: Diagnosis present

## 2021-10-08 DIAGNOSIS — N920 Excessive and frequent menstruation with regular cycle: Secondary | ICD-10-CM | POA: Insufficient documentation

## 2021-10-08 DIAGNOSIS — K810 Acute cholecystitis: Principal | ICD-10-CM | POA: Insufficient documentation

## 2021-10-08 DIAGNOSIS — R1010 Upper abdominal pain, unspecified: Principal | ICD-10-CM

## 2021-10-08 DIAGNOSIS — K5900 Constipation, unspecified: Secondary | ICD-10-CM | POA: Diagnosis present

## 2021-10-08 DIAGNOSIS — Z79899 Other long term (current) drug therapy: Secondary | ICD-10-CM | POA: Insufficient documentation

## 2021-10-08 DIAGNOSIS — Z8049 Family history of malignant neoplasm of other genital organs: Secondary | ICD-10-CM

## 2021-10-08 DIAGNOSIS — I10 Essential (primary) hypertension: Secondary | ICD-10-CM | POA: Diagnosis present

## 2021-10-08 DIAGNOSIS — Z8349 Family history of other endocrine, nutritional and metabolic diseases: Secondary | ICD-10-CM

## 2021-10-08 DIAGNOSIS — R102 Pelvic and perineal pain: Secondary | ICD-10-CM | POA: Diagnosis present

## 2021-10-08 DIAGNOSIS — Z803 Family history of malignant neoplasm of breast: Secondary | ICD-10-CM

## 2021-10-08 LAB — MAGNESIUM: Magnesium: 2 mg/dL (ref 1.7–2.4)

## 2021-10-08 LAB — COMPREHENSIVE METABOLIC PANEL
ALT: 17 U/L (ref 0–44)
AST: 19 U/L (ref 15–41)
Albumin: 3.7 g/dL (ref 3.5–5.0)
Alkaline Phosphatase: 49 U/L (ref 38–126)
Anion gap: 10 (ref 5–15)
BUN: 14 mg/dL (ref 6–20)
CO2: 28 mmol/L (ref 22–32)
Calcium: 8.7 mg/dL — ABNORMAL LOW (ref 8.9–10.3)
Chloride: 99 mmol/L (ref 98–111)
Creatinine, Ser: 0.97 mg/dL (ref 0.44–1.00)
GFR, Estimated: >60 mL/min (ref >60)
Glucose, Bld: 135 mg/dL — ABNORMAL HIGH (ref 70–99)
Potassium: 2.8 mmol/L — ABNORMAL LOW (ref 3.5–5.1)
Sodium: 137 mmol/L (ref 135–145)
Total Bilirubin: 0.6 mg/dL (ref 0.3–1.2)
Total Protein: 7.2 g/dL (ref 6.5–8.1)

## 2021-10-08 LAB — CBC
HCT: 40.6 % (ref 36.0–46.0)
Hemoglobin: 13.8 g/dL (ref 12.0–15.0)
MCH: 31 pg (ref 26.0–34.0)
MCHC: 34 g/dL (ref 30.0–36.0)
MCV: 91.2 fL (ref 80.0–100.0)
Platelets: 341 10*3/uL (ref 150–400)
RBC: 4.45 MIL/uL (ref 3.87–5.11)
RDW: 12.4 % (ref 11.5–15.5)
WBC: 12 10*3/uL — ABNORMAL HIGH (ref 4.0–10.5)
nRBC: 0 % (ref 0.0–0.2)

## 2021-10-08 LAB — LIPASE, BLOOD: Lipase: 33 U/L (ref 11–51)

## 2021-10-08 LAB — HCG, QUANTITATIVE, PREGNANCY: hCG, Beta Chain, Quant, S: <1 m[IU]/mL (ref <5)

## 2021-10-08 MED ORDER — HYDRALAZINE HCL 20 MG/ML IJ SOLN
10.0000 mg | Freq: Four times a day (QID) | INTRAMUSCULAR | Status: DC | PRN
Start: 2021-10-08 — End: 2021-10-11

## 2021-10-08 MED ORDER — SODIUM CHLORIDE 0.9 % IV SOLN
2.0000 g | INTRAVENOUS | Status: DC
  Administered 2021-10-08 – 2021-10-10 (×3): 2 g via INTRAVENOUS
  Filled 2021-10-08 (×3): qty 20

## 2021-10-08 MED ORDER — ALBUTEROL SULFATE (2.5 MG/3ML) 0.083% IN NEBU
2.5000 mg | INHALATION_SOLUTION | RESPIRATORY_TRACT | Status: DC | PRN
Start: 2021-10-08 — End: 2021-10-11

## 2021-10-08 MED ORDER — POLYETHYLENE GLYCOL 3350 17 G PO PACK: 17.0000 g | PACK | Freq: Two times a day (BID) | ORAL | Status: DC

## 2021-10-08 MED ORDER — ACETAMINOPHEN 325 MG PO TABS
650.0000 mg | ORAL_TABLET | Freq: Four times a day (QID) | ORAL | Status: DC | PRN
Start: 2021-10-08 — End: 2021-10-11

## 2021-10-08 MED ORDER — BISACODYL 10 MG RE SUPP
10.0000 mg | Freq: Once | RECTAL | Status: AC
  Administered 2021-10-08: 10 mg via RECTAL
  Filled 2021-10-08: qty 1

## 2021-10-08 MED ORDER — BISACODYL 10 MG RE SUPP
10.0000 mg | Freq: Every day | RECTAL | Status: DC | PRN
Start: 2021-10-08 — End: 2021-10-11

## 2021-10-08 MED ORDER — HEPARIN SODIUM (PORCINE) 5000 UNIT/ML IJ SOLN
5000.0000 [IU] | Freq: Three times a day (TID) | INTRAMUSCULAR | Status: DC
  Filled 2021-10-08: qty 1

## 2021-10-08 MED ORDER — LEVETIRACETAM 500 MG PO TABS
1000.0000 mg | ORAL_TABLET | Freq: Every morning | ORAL | Status: DC
  Administered 2021-10-09 – 2021-10-11 (×2): 1000 mg via ORAL
  Filled 2021-10-08 (×2): qty 2

## 2021-10-08 MED ORDER — PANTOPRAZOLE SODIUM 40 MG IV SOLR
40.0000 mg | INTRAVENOUS | Status: DC
  Administered 2021-10-08 – 2021-10-10 (×3): 40 mg via INTRAVENOUS
  Filled 2021-10-08 (×3): qty 10

## 2021-10-08 MED ORDER — SODIUM CHLORIDE 0.9% FLUSH: 3.0000 mL | INTRAVENOUS | Status: DC | PRN

## 2021-10-08 MED ORDER — TRAZODONE HCL 50 MG PO TABS: 50.0000 mg | ORAL_TABLET | Freq: Every evening | ORAL | Status: DC | PRN

## 2021-10-08 MED ORDER — SODIUM CHLORIDE 0.9% FLUSH
3.0000 mL | Freq: Two times a day (BID) | INTRAVENOUS | Status: DC
  Administered 2021-10-09 (×2): 3 mL via INTRAVENOUS

## 2021-10-08 MED ORDER — POTASSIUM CHLORIDE CRYS ER 20 MEQ PO TBCR
40.0000 meq | EXTENDED_RELEASE_TABLET | ORAL | Status: AC
Start: 2021-10-08 — End: 2021-10-09
  Administered 2021-10-08 – 2021-10-09 (×2): 40 meq via ORAL
  Filled 2021-10-08 (×2): qty 2

## 2021-10-08 MED ORDER — KETOROLAC TROMETHAMINE 15 MG/ML IJ SOLN
15.0000 mg | Freq: Four times a day (QID) | INTRAMUSCULAR | Status: DC
  Administered 2021-10-09 (×2): 15 mg via INTRAVENOUS
  Filled 2021-10-08 (×2): qty 1

## 2021-10-08 MED ORDER — POTASSIUM CHLORIDE 10 MEQ/100ML IV SOLN
10.0000 meq | INTRAVENOUS | Status: AC
  Administered 2021-10-08 (×4): 10 meq via INTRAVENOUS
  Filled 2021-10-08: qty 100

## 2021-10-08 MED ORDER — SODIUM CHLORIDE 0.9% FLUSH
3.0000 mL | Freq: Two times a day (BID) | INTRAVENOUS | Status: DC
  Administered 2021-10-09 – 2021-10-11 (×4): 3 mL via INTRAVENOUS

## 2021-10-08 MED ORDER — IOHEXOL 300 MG/ML  SOLN
100.0000 mL | Freq: Once | INTRAMUSCULAR | Status: AC | PRN
  Administered 2021-10-08: 100 mL via INTRAVENOUS

## 2021-10-08 MED ORDER — ACETAMINOPHEN 650 MG RE SUPP: 650.0000 mg | Freq: Four times a day (QID) | RECTAL | Status: DC | PRN

## 2021-10-08 MED ORDER — OXYCODONE HCL 5 MG PO TABS
5.0000 mg | ORAL_TABLET | ORAL | Status: DC | PRN
  Administered 2021-10-09: 5 mg via ORAL
  Filled 2021-10-08: qty 1

## 2021-10-08 MED ORDER — POTASSIUM CHLORIDE 10 MEQ/100ML IV SOLN
10.0000 meq | Freq: Once | INTRAVENOUS | Status: AC
Start: 2021-10-08 — End: 2021-10-08
  Administered 2021-10-08: 10 meq via INTRAVENOUS
  Filled 2021-10-08: qty 100

## 2021-10-08 MED ORDER — SODIUM CHLORIDE 0.9 % IV SOLN: 250.0000 mL | INTRAVENOUS | Status: DC | PRN

## 2021-10-08 MED ORDER — POLYETHYLENE GLYCOL 3350 17 G PO PACK: 17.0000 g | PACK | Freq: Every day | ORAL | Status: DC | PRN

## 2021-10-08 MED ORDER — PANTOPRAZOLE SODIUM 40 MG IV SOLR
40.0000 mg | Freq: Once | INTRAVENOUS | Status: AC
Start: 2021-10-08 — End: 2021-10-08
  Administered 2021-10-08: 40 mg via INTRAVENOUS
  Filled 2021-10-08: qty 10

## 2021-10-08 MED ORDER — LEVETIRACETAM 500 MG PO TABS
500.0000 mg | ORAL_TABLET | Freq: Every evening | ORAL | Status: DC
Start: 2021-10-08 — End: 2021-10-11
  Administered 2021-10-08 – 2021-10-10 (×3): 500 mg via ORAL
  Filled 2021-10-08 (×3): qty 1

## 2021-10-08 MED ORDER — ONDANSETRON HCL 4 MG PO TABS
4.0000 mg | ORAL_TABLET | Freq: Four times a day (QID) | ORAL | Status: DC | PRN
  Administered 2021-10-11: 4 mg via ORAL
  Filled 2021-10-08: qty 1

## 2021-10-08 MED ORDER — POLYETHYLENE GLYCOL 3350 17 G PO PACK
17.0000 g | PACK | Freq: Once | ORAL | Status: AC
  Administered 2021-10-08: 17 g via ORAL
  Filled 2021-10-08: qty 1

## 2021-10-08 MED ORDER — ONDANSETRON HCL 4 MG/2ML IJ SOLN: 4.0000 mg | Freq: Four times a day (QID) | INTRAMUSCULAR | Status: DC | PRN

## 2021-10-08 MED ORDER — KETOROLAC TROMETHAMINE 30 MG/ML IJ SOLN
30.0000 mg | Freq: Once | INTRAMUSCULAR | Status: AC
  Administered 2021-10-08: 30 mg via INTRAVENOUS
  Filled 2021-10-08: qty 1

## 2021-10-08 MED ORDER — DEXTROSE-NACL 5-0.9 % IV SOLN: INTRAVENOUS | Status: DC

## 2021-10-08 NOTE — Plan of Care (Signed)
?  Problem: Education: ?Goal: Knowledge of General Education information will improve ?Description: Including pain rating scale, medication(s)/side effects and non-pharmacologic comfort measures ?10/08/2021 2112 by Karolee Ohs, RN ?Outcome: Progressing ?10/08/2021 2111 by Karolee Ohs, RN ?Outcome: Progressing ?  ?Problem: Health Behavior/Discharge Planning: ?Goal: Ability to manage health-related needs will improve ?10/08/2021 2112 by Karolee Ohs, RN ?Outcome: Progressing ?10/08/2021 2111 by Karolee Ohs, RN ?Outcome: Progressing ?  ?Problem: Clinical Measurements: ?Goal: Will remain free from infection ?Outcome: Progressing ?  ?

## 2021-10-08 NOTE — ED Triage Notes (Signed)
Pt with c/o abd pain since early Thursday morning.  Seen PCP on Thursday.  Prescribed nausea meds and sent here for xray yesterday, pt does not know results.  Pt with continued pain. ?

## 2021-10-08 NOTE — H&P (Signed)
?                                                                                           ? ? ? ? Patient Demographics:  ? ? ?Melissa Skinner, is a 47 y.o. female  MRN: [id number]   DOB - 10-27-1975 ? ?Admit Date - 10/08/2021 ? ?Outpatient Primary MD for the patient is Avis Epley, PA-C ? ? Assessment & Plan:  ? ?Assessment and Plan: ? ?1) acute cholecystitis--- discussed with general surgeon Dr. Henreitta Leber ?-LFTs not elevated ?-Leukocytosis noted ?-Does not meet sepsis criteria per se ?-Repeat CMP in a.m. if LFTs are elevated consider right upper quadrant ultrasound, otherwise no need for ultrasound given CT  AP and clinical exam finding ?-Defer timing of lap chole to general surgeon ?-IV Toradol and p.o. oxycodone for pain control ?-Zofran as needed for nausea ?-iv Rocephin empirically ? ?2) history of seizures----PTA was on p.o. Keppra, changed to IV for now, until oral intake is more reliable ? ?3) hypokalemia--- in the setting of poor oral intake due to #1 above ?-Replace potassium ?- serum mag 2.0 ? ?4)HTN-stable, hold HCTZ given poor oral intake due to #1 above ?- may use IV Hydralazine 10 mg  Every 4 hours Prn for systolic blood pressure over 170 mmhg ? ?5)Menometrorrhagia--- CT suggest uterine fibroids ?-Hold OCP for now ?-Patient is currently having a menstrual flow ?-Check TSH ? ?6) mild intermittent asthma--- no recent flareups ?-May use albuterol as needed ? ?Disposition/Need for in-Hospital Stay- patient unable to be discharged at this time due to ----acute cholecystitis requiring IV antibiotics and IV fluids pending further surgical intervention* ? ?Status is: Inpatient ? ?Dispo: The patient is from: Home ?             Anticipated d/c is to: Home ?             Anticipated d/c date is: 2 days ?             Patient currently is not medically stable to d/c. ?Barriers: Not Clinically Stable-  ? ?With History of - ?Reviewed by  me ? ?Past Medical History:  ?Diagnosis Date  ? Asthma   ? Carpal tunnel syndrome   ? Epilepsy (HCC)   ?   ? ?Past Surgical History:  ?Procedure Laterality Date  ? heart catherization    ? TONSILLECTOMY    ? ? ?Chief Complaint  ?Patient presents with  ? Abdominal Pain  ?  ? ? HPI:  ? ? Melissa Skinner  is a 46 y.o. female with past medical history relevant for menometrorrhagia, mild intermittent asthma, HTN and history of seizures who presents to the ED with concerns about nausea and epigastric as well as right upper quadrant pain since 3 AM on 10/06/2021 ?-On 10/05/2021 around 7 PM patient had hotdogs and chili, with coleslaw----she woke up 2 AM on 10/06/2021 with nausea and abdominal pain initially in the epigastric area then radiated to right upper quadrant area ?-Denies frank emesis ?-Patient has had difficulty with oral intake due to nausea and abdominal pain since then ?-She had abdominal x-rays done on 10/07/2021  with some retained stool otherwise no acute findings ?- ?Presented to the ED on 10/08/2020 persistent symptoms and CT abdomen and pelvis showed--Gallbladder distension, wall thickening and pericholecystic inflammation highly suspicious for acute cholecystitis. Sludge versus gallstones are noted within the gallbladder. No evidence of biliary dilatation. Trace amount of free fluid in the pelvis. ?---Heterogeneous probable fibroid uterus. ?- ?EDP discussed with on-call general surgeon Dr. Henreitta LeberBridges recommended hospitalist admit await further decision regarding timing of lap chole ?-WBCs 12.0 otherwise CBC WNL ?-Potassium is 2.8,--creatinine 0.97 BUN 14 ?-LFTs WNL ?-Lipase is Not elevated ?-Additional history obtained from patient's husband at bedside ?No fever  Or chills  ? ? Review of systems:  ?  ?In addition to the HPI above,  ? ?A full Review of  Systems was done, all other systems reviewed are negative except as noted above in HPI , . ? ? ? Social History:  ?Reviewed by me ? ?  ?Social History  ? ?Tobacco Use   ? Smoking status: Never  ? Smokeless tobacco: Never  ?Substance Use Topics  ? Alcohol use: No  ? ? ? Family History :  ?Reviewed by me ? ?  ?Family History  ?Problem Relation Age of Onset  ? Thyroid disease Mother   ? Macular degeneration Mother   ? Diabetes type I Father   ? Arthritis Father   ? Uterine cancer Cousin   ? Breast cancer Maternal Aunt   ? ? Home Medications:  ? ?Prior to Admission medications   ?Medication Sig Start Date End Date Taking? Authorizing Provider  ?albuterol (PROVENTIL HFA;VENTOLIN HFA) 108 (90 BASE) MCG/ACT inhaler Inhale 2 puffs into the lungs every 4 (four) hours as needed. 07/03/15   Devoria AlbeKnapp, Iva, MD  ?cetirizine (ZYRTEC) 10 MG tablet Take 10 mg by mouth daily.    [provider]  ?hydrochlorothiazide (MICROZIDE) 12.5 MG capsule TAKE 1 CAPSULE(12.5 MG) BY MOUTH DAILY ?Patient taking differently: Take 12.5 mg by mouth daily. 12/15/20   Adline PotterGriffin, Jennifer A, NP  ?levETIRAcetam (KEPPRA) 1000 MG tablet Take 1,500 mg by mouth daily. 09/03/21   [provider]  ?levETIRAcetam (KEPPRA) 500 MG tablet Take 500 mg by mouth 2 (two) times daily. 08/13/20   [provider]  ?LO LOESTRIN FE 1 MG-10 MCG / 10 MCG tablet TAKE 1 TABLET BY MOUTH DAILY 08/15/21   Cyril MourningGriffin, Jennifer A, NP  ?MAGNESIUM CL-CALCIUM CARBONATE PO Take by mouth.    [provider]  ?Multiple Vitamins-Minerals (MULTIVITAMIN WITH MINERALS) tablet Take 1 tablet by mouth daily.    [provider]  ?ondansetron (ZOFRAN) 4 MG tablet Take 4-8 mg by mouth every 6 (six) hours. 10/06/21   [provider]  ?Probiotic Product (PROBIOTIC-10 PO) Take by mouth.    [provider]  ? ? ? Allergies:  ? ?  ?Allergies  ?Allergen Reactions  ? Molds & Smuts   ? Other   ?  Cats,rabbits, dust, pollen  ? ? ? Physical Exam:  ? ?Vitals ? ?Blood pressure 116/82, pulse 74, temperature 97.9 ?F (36.6 ?C), temperature source Oral, resp. rate 13, height 5' 4.5" (1.638 m), weight 71.2 kg, last menstrual  period 09/23/2021, SpO2 98 %. ? ?Physical Examination: General appearance - alert,  and in no distress   ?Mental status - alert, oriented to person, place, and time,  ?Eyes - sclera anicteric ?Neck - supple, no JVD elevation , ?Chest - clear  to auscultation bilaterally, symmetrical air movement,  ?Heart - S1 and S2 normal, regular  ?Abdomen -  soft, , nondistended, +BS, epigastric and right upper quadrant tenderness with positive Murphy sign  ?-neurological - screening mental status exam normal, neck supple without rigidity, cranial nerves II through XII intact, DTR's normal and symmetric ?Extremities - no pedal edema noted, intact peripheral pulses  ?Skin - warm, dry ? ? ? ? Data Review:  ? ? CBC ?Recent Labs  ?Lab 10/08/21 ?1441  ?WBC 12.0*  ?HGB 13.8  ?HCT 40.6  ?PLT 341  ?MCV 91.2  ?MCH 31.0  ?MCHC 34.0  ?RDW 12.4  ? ?------------------------------------------------------------------------------------------------------------------ ? ?Chemistries  ?Recent Labs  ?Lab 10/08/21 ?1441  ?NA 137  ?K 2.8*  ?CL 99  ?CO2 28  ?GLUCOSE 135*  ?BUN 14  ?CREATININE 0.97  ?CALCIUM 8.7*  ?AST 19  ?ALT 17  ?ALKPHOS 49  ?BILITOT 0.6  ? ?------------------------------------------------------------------------------------------------------------------ ?estimated creatinine clearance is 70.9 mL/min (by C-G formula based on SCr of 0.97 mg/dL). ?------------------------------------------------------------------------------------------ ? Imaging Results:  ? ? CT ABDOMEN PELVIS W CONTRAST ? ?Result Date: 10/08/2021 ?CLINICAL DATA:  46 year old female with acute abdominal pain and nausea. EXAM: CT ABDOMEN AND PELVIS WITH CONTRAST TECHNIQUE: Multidetector CT imaging of the abdomen and pelvis was performed using the standard protocol following bolus administration of intravenous contrast. RADIATION DOSE REDUCTION: This exam was performed according to the departmental dose-optimization program which includes automated exposure control,  adjustment of the mA and/or kV according to patient size and/or use of iterative reconstruction technique. CONTRAST:  OMNIPAQUE IOHEXOL 300 MG/ML  SOLN COMPARISON:  None. FINDINGS: Lower chest: No acute a

## 2021-10-08 NOTE — ED Provider Notes (Signed)
?[facility] ?Provider Note ? ? ?CSN: [id number] ?Arrival date & time: 10/08/21  1430 ? ?  ? ?History ? ?Chief Complaint  ?Patient presents with  ? Abdominal Pain  ? ? ?Melissa Skinner is a 46 y.o. female. ? ?Patient with abdominal pain for 24 hours.  The pain is epigastric.  Patient has a history of seizures ? ?The history is provided by the patient and medical records. No language interpreter was used.  ?Abdominal Pain ?Pain location:  Epigastric ?Pain quality: aching   ?Pain radiates to:  Does not radiate ?Pain severity:  Moderate ?Onset quality:  Sudden ?Timing:  Constant ?Progression:  Worsening ?Chronicity:  New ?Context: not alcohol use   ?Relieved by:  Nothing ?Associated symptoms: no chest pain, no cough, no diarrhea, no fatigue and no hematuria   ? ?  ? ?Home Medications ?Prior to Admission medications   ?Medication Sig Start Date End Date Taking? Authorizing Provider  ?albuterol (PROVENTIL HFA;VENTOLIN HFA) 108 (90 BASE) MCG/ACT inhaler Inhale 2 puffs into the lungs every 4 (four) hours as needed. 07/03/15   Devoria Albe, MD  ?cetirizine (ZYRTEC) 10 MG tablet Take 10 mg by mouth daily.    [provider]  ?hydrochlorothiazide (MICROZIDE) 12.5 MG capsule TAKE 1 CAPSULE(12.5 MG) BY MOUTH DAILY ?Patient taking differently: Take 12.5 mg by mouth daily. 12/15/20   Adline Potter, NP  ?levETIRAcetam (KEPPRA) 1000 MG tablet Take 1,500 mg by mouth daily. 09/03/21   [provider]  ?levETIRAcetam (KEPPRA) 500 MG tablet Take 500 mg by mouth 2 (two) times daily. 08/13/20   [provider]  ?LO LOESTRIN FE 1 MG-10 MCG / 10 MCG tablet TAKE 1 TABLET BY MOUTH DAILY 08/15/21   Cyril Mourning A, NP  ?MAGNESIUM CL-CALCIUM CARBONATE PO Take by mouth.    [provider]  ?Multiple Vitamins-Minerals (MULTIVITAMIN WITH MINERALS) tablet Take 1 tablet by mouth daily.    [provider]  ?ondansetron (ZOFRAN) 4 MG tablet Take 4-8 mg by mouth every 6 (six) hours.  10/06/21   [provider]  ?Probiotic Product (PROBIOTIC-10 PO) Take by mouth.    [provider]  ?   ? ?Allergies    ?Molds & smuts and Other   ? ?Review of Systems   ?Review of Systems  ?Constitutional:  Negative for appetite change and fatigue.  ?HENT:  Negative for congestion, ear discharge and sinus pressure.   ?Eyes:  Negative for discharge.  ?Respiratory:  Negative for cough.   ?Cardiovascular:  Negative for chest pain.  ?Gastrointestinal:  Positive for abdominal pain. Negative for diarrhea.  ?Genitourinary:  Negative for frequency and hematuria.  ?Musculoskeletal:  Negative for back pain.  ?Skin:  Negative for rash.  ?Neurological:  Negative for seizures and headaches.  ?Psychiatric/Behavioral:  Negative for hallucinations.   ? ?Physical Exam ?Updated Vital Signs ?BP 116/82   Pulse 74   Temp 97.9 ?F (36.6 ?C) (Oral)   Resp 13   Ht 5' 4.5" (1.638 m)   Wt 71.2 kg   LMP 09/23/2021 (Approximate)   SpO2 98%   BMI 26.53 kg/m?  ?Physical Exam ?Vitals and nursing note reviewed.  ?Constitutional:   ?   Appearance: She is well-developed.  ?HENT:  ?   Head: Normocephalic.  ?   Nose: Nose normal.  ?Eyes:  ?   General: No scleral icterus. ?   Conjunctiva/sclera: Conjunctivae normal.  ?Neck:  ?   Thyroid: No thyromegaly.  ?Cardiovascular:  ?   Rate and Rhythm:  Normal rate and regular rhythm.  ?   Heart sounds: No murmur heard. ?  No friction rub. No gallop.  ?Pulmonary:  ?   Breath sounds: No stridor. No wheezing or rales.  ?Chest:  ?   Chest wall: No tenderness.  ?Abdominal:  ?   General: There is no distension.  ?   Tenderness: There is abdominal tenderness. There is no rebound.  ?Musculoskeletal:     ?   General: Normal range of motion.  ?   Cervical back: Neck supple.  ?Lymphadenopathy:  ?   Cervical: No cervical adenopathy.  ?Skin: ?   Findings: No erythema or rash.  ?Neurological:  ?   Mental Status: She is alert and oriented to person, place, and time.  ?   Motor: No abnormal muscle tone.   ?   Coordination: Coordination normal.  ?Psychiatric:     ?   Behavior: Behavior normal.  ? ? ?ED Results / Procedures / Treatments   ?Labs ?(all labs ordered are listed, but only abnormal results are displayed) ?Labs Reviewed  ?COMPREHENSIVE METABOLIC PANEL - Abnormal; Notable for the following components:  ?    Result Value  ? Potassium 2.8 (*)   ? Glucose, Bld 135 (*)   ? Calcium 8.7 (*)   ? All other components within normal limits  ?CBC - Abnormal; Notable for the following components:  ? WBC 12.0 (*)   ? All other components within normal limits  ?LIPASE, BLOOD  ?HCG, QUANTITATIVE, PREGNANCY  ?URINALYSIS, ROUTINE W REFLEX MICROSCOPIC  ? ? ?EKG ?None ? ?Radiology ?CT ABDOMEN PELVIS W CONTRAST ? ?Result Date: 10/08/2021 ?CLINICAL DATA:  46 year old female with acute abdominal pain and nausea. EXAM: CT ABDOMEN AND PELVIS WITH CONTRAST TECHNIQUE: Multidetector CT imaging of the abdomen and pelvis was performed using the standard protocol following bolus administration of intravenous contrast. RADIATION DOSE REDUCTION: This exam was performed according to the departmental dose-optimization program which includes automated exposure control, adjustment of the mA and/or kV according to patient size and/or use of iterative reconstruction technique. CONTRAST:  100mL OMNIPAQUE IOHEXOL 300 MG/ML  SOLN COMPARISON:  None. FINDINGS: Lower chest: No acute abnormality. A calcified granuloma in the RIGHT LOWER lobe is noted. Hepatobiliary: Gallbladder distension, wall thickening and pericholecystic inflammation likely represents acute cholecystitis. Sludge versus gallstones are noted within the gallbladder. The liver is unremarkable.  No biliary dilatation identified. Pancreas: Unremarkable Spleen: Unremarkable Adrenals/Urinary Tract: The kidneys, adrenal glands and bladder are unremarkable. Stomach/Bowel: Stomach is within normal limits. Appendix appears normal. No evidence of bowel wall thickening, distention, or inflammatory  changes. Vascular/Lymphatic: No significant vascular findings are present. No enlarged abdominal or pelvic lymph nodes. Reproductive: Heterogeneous uterus probably contain fibroids. No adnexal mass is noted. Other: Trace amount of free fluid in the pelvis is noted. No evidence of pneumoperitoneum or abscess. Musculoskeletal: No acute or suspicious bony abnormalities are noted. IMPRESSION: 1. Gallbladder distension, wall thickening and pericholecystic inflammation highly suspicious for acute cholecystitis. Sludge versus gallstones are noted within the gallbladder. No evidence of biliary dilatation. Trace amount of free fluid in the pelvis. 2. Heterogeneous probable fibroid uterus. Electronically Signed   By: Harmon PierJeffrey  Hu M.D.   On: 10/08/2021 18:36   ? ?Procedures ?Procedures  ? ? ?Medications Ordered in ED ?Medications  ?cefTRIAXone (ROCEPHIN) 2 g in sodium chloride 0.9 % 100 mL IVPB (has no administration in time range)  ?hydrALAZINE (APRESOLINE) injection 10 mg (has no administration in time range)  ?pantoprazole (PROTONIX) injection 40 mg (40  mg Intravenous Given 10/08/21 1534)  ?potassium chloride 10 mEq in 100 mL IVPB (0 mEq Intravenous Stopped 10/08/21 1852)  ?iohexol (OMNIPAQUE) 300 MG/ML solution 100 mL (100 mLs Intravenous Contrast Given 10/08/21 1822)  ? ? ?ED Course/ Medical Decision Making/ A&P ?I ?This patient presents to the ED for concern of abdominal pain, this involves an extensive number of treatment options, and is a complaint that carries with it a high risk of complications and morbidity.  The differential diagnosis includes gastritis and cholecystitis ? ? ?Comorbidities.  Seizures ? ? ?Additional hisSeizuresy obtained: ? ?Additional history obtained from family ?External records from outside source obtained and reviewed including hospital records ? ? ?Lab Tests: ? ?I Ordered, and personally interpreted labs.  The pertinent results include: CBC and chemistries which showed hypokalemia and minor  elevated white count ? ? ?Imaging Studies ordered: ? ?I ordered imaging studies including CT abdomen ?I independently visualized and interpreted imaging which showed possible cholecystitis ?I agree with the radiolog

## 2021-10-08 NOTE — ED Notes (Signed)
Patient attempted to give urine sample at this time. Unable to give sample.  ?

## 2021-10-08 NOTE — Plan of Care (Signed)
  Problem: Education: Goal: Knowledge of General Education information will improve Description Including pain rating scale, medication(s)/side effects and non-pharmacologic comfort measures Outcome: Progressing   Problem: Health Behavior/Discharge Planning: Goal: Ability to manage health-related needs will improve Outcome: Progressing   

## 2021-10-08 NOTE — ED Notes (Signed)
Patient transported to CT 

## 2021-10-09 DIAGNOSIS — K810 Acute cholecystitis: Secondary | ICD-10-CM

## 2021-10-09 DIAGNOSIS — E876 Hypokalemia: Secondary | ICD-10-CM | POA: Diagnosis present

## 2021-10-09 DIAGNOSIS — Z20822 Contact with and (suspected) exposure to covid-19: Secondary | ICD-10-CM | POA: Diagnosis not present

## 2021-10-09 DIAGNOSIS — I10 Essential (primary) hypertension: Secondary | ICD-10-CM | POA: Diagnosis not present

## 2021-10-09 DIAGNOSIS — R569 Unspecified convulsions: Secondary | ICD-10-CM

## 2021-10-09 DIAGNOSIS — N920 Excessive and frequent menstruation with regular cycle: Secondary | ICD-10-CM | POA: Diagnosis not present

## 2021-10-09 DIAGNOSIS — K5900 Constipation, unspecified: Secondary | ICD-10-CM | POA: Diagnosis not present

## 2021-10-09 DIAGNOSIS — N921 Excessive and frequent menstruation with irregular cycle: Secondary | ICD-10-CM

## 2021-10-09 DIAGNOSIS — R102 Pelvic and perineal pain: Secondary | ICD-10-CM | POA: Diagnosis present

## 2021-10-09 LAB — URINALYSIS, ROUTINE W REFLEX MICROSCOPIC
Bacteria, UA: NONE SEEN
Bilirubin Urine: NEGATIVE
Glucose, UA: NEGATIVE mg/dL
Ketones, ur: 5 mg/dL — AB
Nitrite: NEGATIVE
Protein, ur: 30 mg/dL — AB
Specific Gravity, Urine: 1.041 — ABNORMAL HIGH (ref 1.005–1.030)
pH: 6 (ref 5.0–8.0)

## 2021-10-09 LAB — CBC
HCT: 37 % (ref 36.0–46.0)
Hemoglobin: 12 g/dL (ref 12.0–15.0)
MCH: 30 pg (ref 26.0–34.0)
MCHC: 32.4 g/dL (ref 30.0–36.0)
MCV: 92.5 fL (ref 80.0–100.0)
Platelets: 289 10*3/uL (ref 150–400)
RBC: 4 MIL/uL (ref 3.87–5.11)
RDW: 12.5 % (ref 11.5–15.5)
WBC: 9.7 10*3/uL (ref 4.0–10.5)
nRBC: 0 % (ref 0.0–0.2)

## 2021-10-09 LAB — GLUCOSE, CAPILLARY
Glucose-Capillary: 110 mg/dL — ABNORMAL HIGH (ref 70–99)
Glucose-Capillary: 87 mg/dL (ref 70–99)
Glucose-Capillary: 90 mg/dL (ref 70–99)
Glucose-Capillary: 99 mg/dL (ref 70–99)
Glucose-Capillary: 99 mg/dL (ref 70–99)

## 2021-10-09 LAB — COMPREHENSIVE METABOLIC PANEL
ALT: 14 U/L (ref 0–44)
AST: 16 U/L (ref 15–41)
Albumin: 3.1 g/dL — ABNORMAL LOW (ref 3.5–5.0)
Alkaline Phosphatase: 40 U/L (ref 38–126)
Anion gap: 7 (ref 5–15)
BUN: 10 mg/dL (ref 6–20)
CO2: 25 mmol/L (ref 22–32)
Calcium: 8 mg/dL — ABNORMAL LOW (ref 8.9–10.3)
Chloride: 107 mmol/L (ref 98–111)
Creatinine, Ser: 0.81 mg/dL (ref 0.44–1.00)
GFR, Estimated: >60 mL/min (ref >60)
Glucose, Bld: 95 mg/dL (ref 70–99)
Potassium: 4 mmol/L (ref 3.5–5.1)
Sodium: 139 mmol/L (ref 135–145)
Total Bilirubin: 0.5 mg/dL (ref 0.3–1.2)
Total Protein: 6.1 g/dL — ABNORMAL LOW (ref 6.5–8.1)

## 2021-10-09 LAB — RESP PANEL BY RT-PCR (FLU A&B, COVID) ARPGX2
Influenza A by PCR: NEGATIVE
Influenza B by PCR: NEGATIVE
SARS Coronavirus 2 by RT PCR: NEGATIVE

## 2021-10-09 LAB — TSH: TSH: 2.588 u[IU]/mL (ref 0.350–4.500)

## 2021-10-09 MED ORDER — POLYETHYLENE GLYCOL 3350 17 G PO PACK: 17.0000 g | PACK | Freq: Every day | ORAL | Status: DC

## 2021-10-09 MED ORDER — KETOROLAC TROMETHAMINE 15 MG/ML IJ SOLN: 15.0000 mg | Freq: Four times a day (QID) | INTRAMUSCULAR | Status: DC | PRN

## 2021-10-09 MED ORDER — ENOXAPARIN SODIUM 30 MG/0.3ML IJ SOSY: 30.0000 mg | PREFILLED_SYRINGE | INTRAMUSCULAR | Status: DC

## 2021-10-09 MED ORDER — BISACODYL 10 MG RE SUPP
10.0000 mg | Freq: Once | RECTAL | Status: AC
  Administered 2021-10-09: 10 mg via RECTAL
  Filled 2021-10-09: qty 1

## 2021-10-09 MED ORDER — ENOXAPARIN SODIUM 40 MG/0.4ML IJ SOSY
40.0000 mg | PREFILLED_SYRINGE | INTRAMUSCULAR | Status: DC
  Administered 2021-10-11: 40 mg via SUBCUTANEOUS
  Filled 2021-10-09: qty 0.4

## 2021-10-09 MED ORDER — KETOROLAC TROMETHAMINE 15 MG/ML IJ SOLN
30.0000 mg | Freq: Four times a day (QID) | INTRAMUSCULAR | Status: DC | PRN
  Administered 2021-10-09 – 2021-10-10 (×3): 30 mg via INTRAVENOUS
  Filled 2021-10-09 (×4): qty 2

## 2021-10-09 MED ORDER — SENNOSIDES-DOCUSATE SODIUM 8.6-50 MG PO TABS
1.0000 | ORAL_TABLET | Freq: Two times a day (BID) | ORAL | Status: DC
  Administered 2021-10-09 – 2021-10-11 (×4): 1 via ORAL
  Filled 2021-10-09 (×4): qty 1

## 2021-10-09 MED ORDER — LORATADINE 10 MG PO TABS
10.0000 mg | ORAL_TABLET | Freq: Every day | ORAL | Status: DC
  Administered 2021-10-09 – 2021-10-11 (×2): 10 mg via ORAL
  Filled 2021-10-09 (×2): qty 1

## 2021-10-09 NOTE — Progress Notes (Signed)
?PROGRESS NOTE ? ? ? ?Melissa Skinner  GMW:[id number] DOB: Nov 15, 1975 DOA: 10/08/2021 ?PCP: [name], Melissa J, PA-C  ? ? ?Chief Complaint  ?Patient presents with  ? Abdominal Pain  ? ? ?Brief Narrative:  ?Patient is a pleasant 46 year old female history of menometrorrhagia, mild intermittent asthma, hypertension, history of seizures presented to the ED with nausea, epigastric and right upper quadrant abdominal pain.  Onset of pain a few hours after eating hotdogs, chili and coleslaw.  Patient noted to have pain in the epigastric area radiating to the right upper quadrant.  No emesis noted.  Decreased appetite.  Right upper quadrant pain associated with further oral intake.  Patient had abdominal x-rays done 10/07/2021 showing some retained stool with no acute abnormalities.  Patient presented to the ED 10/08/2020 with persistent symptoms CT abdomen and pelvis concerning for acute cholecystitis.  General surgery consulted.  Patient noted to have a leukocytosis.  LFTs within normal limits.  Lipase within normal limits.  Patient placed empirically on IV Rocephin pending general surgical evaluation.  Patient currently on bowel rest. ? ? ?Assessment & Plan: ?  ?Principal Problem: ?  Acute cholecystitis ?Active Problems: ?  Menorrhagia with irregular cycle ?  Hypertension ?  Partial Seizure (HCC) ?  Constipation ?  Hypokalemia ?  Suprapubic abdominal pain ? ?#1 acute cholecystitis ?-Patient presenting with nausea, epigastric to right upper quadrant pain associated with oral intake.  LFTs within normal limits.  Patient with a leukocytosis. ?-CT abdomen and pelvis done as well as clinical findings concerning for acute cholecystitis. ?-Patient currently on bowel rest. ?-Continue IV fluids, IV antiemetics. ?-Change scheduled IV Toradol to as needed. ?-Continue IV antiemetics as needed. ?-Continue IV Rocephin. ?-General surgical consultation pending. ? ?2.  History of seizures ?-Stable. ?-Continue home regimen oral Keppra. ? ?3.   Hypokalemia ?-Magnesium 2.0. ?-Potassium repleted. ? ?4.  Hypertension ?-BP stable. ?-Continue to hold HCTZ. ?-IV hydralazine as needed. ? ?5.  Metromenorrhagia ?-CT suggesting uterine fibroids. ?-Oral contraceptive on hold. ?-Patient noted to currently be having the menstrual flow. ?-TSH pending. ?-Outpatient follow-up with OB/GYN. ? ?6.  Suprapubic abdominal pain ?-Patient stated noted to be constipated on prior plain films of the abdomen done. ?-Patient stated had somewhat of a bowel movement yesterday. ?-Check a UA with cultures and sensitivities. ?-Patient on IV Rocephin secondary to problem #1. ?-Bowel regimen. ? ?7.  Constipation ?-Noted on plain films of the abdomen 10/07/2021 ?-Dulcolax suppository x1. ?-Senokot-S twice daily. ?-MiraLAX daily (start tomorrow) ? ? ? ?DVT prophylaxis: SCDs>>>>> Lovenox (tomorrow if okay with general surgery.) ?Code Status: Full  ?Family Communication: Updated patient.  No family at bedside. ?Disposition:  ? ?Status is: Inpatient ?Remains inpatient appropriate because: Severity of illness ?  ?Consultants:  ?General surgery pending ? ?Procedures: ?CT abdomen and pelvis 10/08/2021 ? ? ? ?Antimicrobials:  ?IV Rocephin 10/08/2021>>>>> ? ? ?Subjective: ?Sleeping but easily arousable.  Denies any chest pain.  No shortness of breath.  States improvement with abdominal pain on current pain regimen.  No emesis currently. ? ?Objective: ?Vitals:  ? 10/08/21 2000 10/08/21 2056 10/09/21 0100 10/09/21 [id number]  ?BP: 129/81 138/80 116/77 122/73  ?Pulse: 78 69 64 64  ?Resp: 16 18 17 20   ?Temp: 98.2 ?F (36.8 ?C) 98.5 ?F (36.9 ?C) 98.3 ?F (36.8 ?C) 97.8 ?F (36.6 ?C)  ?TempSrc: Oral Oral Oral Oral  ?SpO2: 98% 96% 98% 98%  ?Weight:  72.2 kg    ?Height:      ? ? ?Intake/Output Summary (Last 24 hours) at 10/09/2021  3716 ?Last data filed at 10/08/2021 2245 ?Gross per 24 hour  ?Intake 200.05 ml  ?Output --  ?Net 200.05 ml  ? ?Filed Weights  ? 10/08/21 1436 10/08/21 2056  ?Weight: 71.2 kg 72.2 kg   ? ? ?Examination: ? ?General exam: Appears calm and comfortable  ?Respiratory system: Clear to auscultation. Respiratory effort normal. ?Cardiovascular system: S1 & S2 heard, RRR. No JVD, murmurs, rubs, gallops or clicks. No pedal edema. ?Gastrointestinal system: Abdomen is nondistended, soft and tender to palpation in the epigastrium and right upper quadrant.  Some suprapubic tenderness to palpation.  Positive bowel sounds.  No rebound.  No guarding.  ?Central nervous system: Alert and oriented. No focal neurological deficits. ?Extremities: Symmetric 5 x 5 power. ?Skin: No rashes, lesions or ulcers ?Psychiatry: Judgement and insight appear normal. Mood & affect appropriate.  ? ? ? ?Data Reviewed: I have personally reviewed following labs and imaging studies ? ?CBC: ?Recent Labs  ?Lab 10/08/21 ?1441 10/09/21 ?0505  ?WBC 12.0* 9.7  ?HGB 13.8 12.0  ?HCT 40.6 37.0  ?MCV 91.2 92.5  ?PLT 341 289  ? ? ?Basic Metabolic Panel: ?Recent Labs  ?Lab 10/08/21 ?1441 10/09/21 ?0505  ?NA 137 139  ?K 2.8* 4.0  ?CL 99 107  ?CO2 28 25  ?GLUCOSE 135* 95  ?BUN 14 10  ?CREATININE 0.97 0.81  ?CALCIUM 8.7* 8.0*  ?MG 2.0  --   ? ? ?GFR: ?Estimated Creatinine Clearance: 85.5 mL/min (by C-G formula based on SCr of 0.81 mg/dL). ? ?Liver Function Tests: ?Recent Labs  ?Lab 10/08/21 ?1441 10/09/21 ?0505  ?AST 19 16  ?ALT 17 14  ?ALKPHOS 49 40  ?BILITOT 0.6 0.5  ?PROT 7.2 6.1*  ?ALBUMIN 3.7 3.1*  ? ? ?CBG: ?Recent Labs  ?Lab 10/09/21 ?9678 10/09/21 ?9381 10/09/21 ?0175  ?GLUCAP 110* 87 99  ? ? ? ?Recent Results (from the past 240 hour(s))  ?Resp Panel by RT-PCR (Flu A&B, Covid) Nasopharyngeal Swab     Status: None  ? Collection Time: 10/09/21  5:01 AM  ? Specimen: Nasopharyngeal Swab; Nasopharyngeal(NP) swabs in vial transport medium  ?Result Value Ref Range Status  ? SARS Coronavirus 2 by RT PCR NEGATIVE NEGATIVE Final  ?  Comment: (NOTE) ?SARS-CoV-2 target nucleic acids are NOT DETECTED. ? ?The SARS-CoV-2 RNA is generally detectable in upper  respiratory ?specimens during the acute phase of infection. The lowest ?concentration of SARS-CoV-2 viral copies this assay can detect is ?138 copies/mL. A negative result does not preclude SARS-Cov-2 ?infection and should not be used as the sole basis for treatment or ?other patient management decisions. A negative result may occur with  ?improper specimen collection/handling, submission of specimen other ?than nasopharyngeal swab, presence of viral mutation(s) within the ?areas targeted by this assay, and inadequate number of viral ?copies(<138 copies/mL). A negative result must be combined with ?clinical observations, patient history, and epidemiological ?information. The expected result is Negative. ? ?Fact Sheet for Patients:  ?BloggerCourse.com ? ?Fact Sheet for Healthcare Providers:  ?SeriousBroker.it ? ?This test is no t yet approved or cleared by the [place] FDA and  ?has been authorized for detection and/or diagnosis of SARS-CoV-2 by ?FDA under an Emergency Use Authorization (EUA). This EUA will remain  ?in effect (meaning this test can be used) for the duration of the ?COVID-19 declaration under Section 564(b)(1) of the Act, 21 ?U.S.C.section 360bbb-3(b)(1), unless the authorization is terminated  ?or revoked sooner.  ? ? ?  ? Influenza A by PCR NEGATIVE NEGATIVE Final  ? Influenza  B by PCR NEGATIVE NEGATIVE Final  ?  Comment: (NOTE) ?The Xpert Xpress SARS-CoV-2/FLU/RSV plus assay is intended as an aid ?in the diagnosis of influenza from Nasopharyngeal swab specimens and ?should not be used as a sole basis for treatment. Nasal washings and ?aspirates are unacceptable for Xpert Xpress SARS-CoV-2/FLU/RSV ?testing. ? ?Fact Sheet for Patients: ?BloggerCourse.com ? ?Fact Sheet for Healthcare Providers: ?SeriousBroker.it ? ?This test is not yet approved or cleared by the [place] FDA and ?has been  authorized for detection and/or diagnosis of SARS-CoV-2 by ?FDA under an Emergency Use Authorization (EUA). This EUA will remain ?in effect (meaning this test can be used) for the duration of the ?COVID-19 declaration unde

## 2021-10-09 NOTE — H&P (View-Only) (Signed)
[facility] Consult ? ?Reason for Consult: Acute cholecystitis  ?Referring Physician: Dr. Janee Morn, MD  ? ?Chief Complaint   ?Abdominal Pain ?  ? ? ?HPI: Melissa Skinner is a 46 y.o. female with epilepsy who is otherwise healthy and comes in with epigastric /RUQ pain since Wednesday of last week. This was made worse with eating hot dogs, [employer], but then was getting somewhat better until she had some cream in her coffee. She has been having associated nausea. CT scan was done with signs of acute cholecystitis. She had a mild leukocytosis and normal LFTs.  ? ?She says her pain is better now but she is still tender. She had a heart catheterization in the past for a PDA ablation it sounds like at 46 yo.  ? ?Past Medical History:  ?Diagnosis Date  ? Asthma   ? Carpal tunnel syndrome   ? Epilepsy (HCC)   ? ? ?Past Surgical History:  ?Procedure Laterality Date  ? heart catherization    ? TONSILLECTOMY    ? ? ?Family History  ?Problem Relation Age of Onset  ? Thyroid disease Mother   ? Macular degeneration Mother   ? Diabetes type I Father   ? Arthritis Father   ? Uterine cancer Cousin   ? Breast cancer Maternal Aunt   ? ? ?Social History  ? ?Tobacco Use  ? Smoking status: Never  ? Smokeless tobacco: Never  ?Vaping Use  ? Vaping Use: Never used  ?Substance Use Topics  ? Alcohol use: No  ? Drug use: Never  ? ? ?Medications: I have reviewed the patient's current medications. ?Prior to Admission:  ?Medications Prior to Admission  ?Medication Sig Dispense Refill Last Dose  ? albuterol (PROVENTIL HFA;VENTOLIN HFA) 108 (90 BASE) MCG/ACT inhaler Inhale 2 puffs into the lungs every 4 (four) hours as needed. 6.7 g 0   ? cetirizine (ZYRTEC) 10 MG tablet Take 10 mg by mouth daily.     ? hydrochlorothiazide (MICROZIDE) 12.5 MG capsule TAKE 1 CAPSULE(12.5 MG) BY MOUTH DAILY (Patient taking differently: Take 12.5 mg by mouth daily.) 90 capsule 2   ? levETIRAcetam (KEPPRA) 1000 MG tablet Take 1,500 mg by mouth  daily.     ? levETIRAcetam (KEPPRA) 500 MG tablet Take 500 mg by mouth 2 (two) times daily.     ? LO LOESTRIN FE 1 MG-10 MCG / 10 MCG tablet TAKE 1 TABLET BY MOUTH DAILY 28 tablet 1   ? MAGNESIUM CL-CALCIUM CARBONATE PO Take by mouth.     ? Multiple Vitamins-Minerals (MULTIVITAMIN WITH MINERALS) tablet Take 1 tablet by mouth daily.     ? ondansetron (ZOFRAN) 4 MG tablet Take 4-8 mg by mouth every 6 (six) hours.     ? Probiotic Product (PROBIOTIC-10 PO) Take by mouth.     ? ?Scheduled: ? [START ON 10/10/2021] enoxaparin (LOVENOX) injection  40 mg Subcutaneous Q24H  ? levETIRAcetam  1,000 mg Oral q morning  ? levETIRAcetam  500 mg Oral QPM  ? loratadine  10 mg Oral Daily  ? pantoprazole (PROTONIX) IV  40 mg Intravenous Q24H  ? [START ON 10/10/2021] polyethylene glycol  17 g Oral Daily  ? senna-docusate  1 tablet Oral BID  ? sodium chloride flush  3 mL Intravenous Q12H  ? sodium chloride flush  3 mL Intravenous Q12H  ? ?Continuous: ? sodium chloride    ? cefTRIAXone (ROCEPHIN)  IV 2 g (10/08/21 2013)  ? dextrose 5 % and 0.9% NaCl 125 mL/hr  at 10/08/21 2245  ? ?PRN:sodium chloride, acetaminophen **OR** acetaminophen, albuterol, bisacodyl, hydrALAZINE, ketorolac, ondansetron **OR** ondansetron (ZOFRAN) IV, oxyCODONE, polyethylene glycol, sodium chloride flush, traZODone ? ?Allergies  ?Allergen Reactions  ? Molds & Smuts   ? Other   ?  Cats,rabbits, dust, pollen  ? ? ? ?ROS:  ?A comprehensive review of systems was negative except for: Gastrointestinal: positive for abdominal pain and nausea ? ?Blood pressure 122/73, pulse 64, temperature 97.8 ?F (36.6 ?C), temperature source Oral, resp. rate 20, height 5' 4.5" (1.638 m), weight 72.2 kg, last menstrual period 09/23/2021, SpO2 98 %. ?Physical Exam ?Vitals reviewed.  ?Constitutional:   ?   Appearance: She is well-developed.  ?HENT:  ?   Head: Normocephalic.  ?Eyes:  ?   Extraocular Movements: Extraocular movements intact.  ?Cardiovascular:  ?   Rate and Rhythm: Normal rate and  regular rhythm.  ?Pulmonary:  ?   Breath sounds: Normal breath sounds.  ?Abdominal:  ?   General: There is no distension.  ?   Palpations: Abdomen is soft.  ?   Tenderness: There is abdominal tenderness in the right upper quadrant and epigastric area.  ?Skin: ?   General: Skin is warm.  ?Neurological:  ?   General: No focal deficit present.  ?   Mental Status: She is alert and oriented to person, place, and time.  ?Psychiatric:     ?   Mood and Affect: Mood normal.     ?   Behavior: Behavior normal.  ? ? ?Results: ?Results for orders placed or performed during the hospital encounter of 10/08/21 (from the past 48 hour(s))  ?Lipase, blood     Status: None  ? Collection Time: 10/08/21  2:41 PM  ?Result Value Ref Range  ? Lipase 33 11 - 51 U/L  ?  Comment: Performed at [facility], [address] [place], [place] [address]  ?Comprehensive metabolic panel     Status: Abnormal  ? Collection Time: 10/08/21  2:41 PM  ?Result Value Ref Range  ? Sodium 137 135 - 145 mmol/L  ? Potassium 2.8 (L) 3.5 - 5.1 mmol/L  ? Chloride 99 98 - 111 mmol/L  ? CO2 28 22 - 32 mmol/L  ? Glucose, Bld 135 (H) 70 - 99 mg/dL  ?  Comment: Glucose reference range applies only to samples taken after fasting for at least 8 hours.  ? BUN 14 6 - 20 mg/dL  ? Creatinine, Ser 0.97 0.44 - 1.00 mg/dL  ? Calcium 8.7 (L) 8.9 - 10.3 mg/dL  ? Total Protein 7.2 6.5 - 8.1 g/dL  ? Albumin 3.7 3.5 - 5.0 g/dL  ? AST 19 15 - 41 U/L  ? ALT 17 0 - 44 U/L  ? Alkaline Phosphatase 49 38 - 126 U/L  ? Total Bilirubin 0.6 0.3 - 1.2 mg/dL  ? GFR, Estimated >60 >60 mL/min  ?  Comment: (NOTE) ?Calculated using the CKD-EPI Creatinine Equation (2021) ?  ? Anion gap 10 5 - 15  ?  Comment: Performed at [facility], [address] [place], [place] [address]  ?CBC     Status: Abnormal  ? Collection Time: 10/08/21  2:41 PM  ?Result Value Ref Range  ? WBC 12.0 (H) 4.0 - 10.5 K/uL  ? RBC 4.45 3.87 - 5.11 MIL/uL  ? Hemoglobin 13.8 12.0 - 15.0 g/dL  ? HCT 40.6 36.0 - 46.0 %  ? MCV 91.2  80.0 - 100.0 fL  ? MCH 31.0 26.0 - 34.0 pg  ?   MCHC 34.0 30.0 - 36.0 g/dL  ? RDW 12.4 11.5 - 15.5 %  ? Platelets 341 150 - 400 K/uL  ? nRBC 0.0 0.0 - 0.2 %  ?  Comment: Performed at [facility], [address], [place], [place] [address]  ?Magnesium     Status: None  ? Collection Time: 10/08/21  2:41 PM  ?Result Value Ref Range  ? Magnesium 2.0 1.7 - 2.4 mg/dL  ?  Comment: Performed at [facility], [address], [place], [place] [address]  ?hCG, quantitative, pregnancy     Status: None  ? Collection Time: 10/08/21  3:13 PM  ?Result Value Ref Range  ? hCG, Beta Chain, Quant, S <1 <5 mIU/mL  ?  Comment:        ?  GEST. AGE      CONC.  (mIU/mL) ?  <=1 WEEK        5 - 50 ?    2 WEEKS       50 - 500 ?    3 WEEKS       100 - 10,000 ?    4 WEEKS     1,000 - 30,000 ?    5 WEEKS     3,500 - 115,000 ?  6-8 WEEKS     12,000 - 270,000 ?   12 WEEKS     15,000 - 220,000 ?       ?FEMALE AND NON-PREGNANT FEMALE: ?    LESS THAN 5 mIU/mL ?Performed at [facility], [address], [place], [place] [address] ?  ?Glucose, capillary     Status: Abnormal  ? Collection Time: 10/09/21 12:19 AM  ?Result Value Ref Range  ? Glucose-Capillary 110 (H) 70 - 99 mg/dL  ?  Comment: Glucose reference range applies only to samples taken after fasting for at least 8 hours.  ?Resp Panel by RT-PCR (Flu A&B, Covid) Nasopharyngeal Swab     Status: None  ? Collection Time: 10/09/21  5:01 AM  ? Specimen: Nasopharyngeal Swab; Nasopharyngeal(NP) swabs in vial transport medium  ?Result Value Ref Range  ? SARS Coronavirus 2 by RT PCR NEGATIVE NEGATIVE  ?  Comment: (NOTE) ?SARS-CoV-2 target nucleic acids are NOT DETECTED. ? ?The SARS-CoV-2 RNA is generally detectable in upper respiratory ?specimens during the acute phase of infection. The lowest ?concentration of SARS-CoV-2 viral copies this assay can detect is ?138 copies/mL. A negative result does not preclude SARS-Cov-2 ?infection and should not be used as the sole basis for treatment or ?other patient  management decisions. A negative result may occur with  ?improper specimen collection/handling, submission of specimen other ?than nasopharyngeal swab, presence of viral mutation(s) within the ?areas targeted by

## 2021-10-09 NOTE — Consult Note (Signed)
[facility] Consult ? ?Reason for Consult: Acute cholecystitis  ?Referring Physician: Dr. Janee Morn, MD  ? ?Chief Complaint   ?Abdominal Pain ?  ? ? ?HPI: Melissa Skinner is a 46 y.o. female with epilepsy who is otherwise healthy and comes in with epigastric /RUQ pain since Wednesday of last week. This was made worse with eating hot dogs, [employer], but then was getting somewhat better until she had some cream in her coffee. She has been having associated nausea. CT scan was done with signs of acute cholecystitis. She had a mild leukocytosis and normal LFTs.  ? ?She says her pain is better now but she is still tender. She had a heart catheterization in the past for a PDA ablation it sounds like at 46 yo.  ? ?Past Medical History:  ?Diagnosis Date  ? Asthma   ? Carpal tunnel syndrome   ? Epilepsy (HCC)   ? ? ?Past Surgical History:  ?Procedure Laterality Date  ? heart catherization    ? TONSILLECTOMY    ? ? ?Family History  ?Problem Relation Age of Onset  ? Thyroid disease Mother   ? Macular degeneration Mother   ? Diabetes type I Father   ? Arthritis Father   ? Uterine cancer Cousin   ? Breast cancer Maternal Aunt   ? ? ?Social History  ? ?Tobacco Use  ? Smoking status: Never  ? Smokeless tobacco: Never  ?Vaping Use  ? Vaping Use: Never used  ?Substance Use Topics  ? Alcohol use: No  ? Drug use: Never  ? ? ?Medications: I have reviewed the patient's current medications. ?Prior to Admission:  ?Medications Prior to Admission  ?Medication Sig Dispense Refill Last Dose  ? albuterol (PROVENTIL HFA;VENTOLIN HFA) 108 (90 BASE) MCG/ACT inhaler Inhale 2 puffs into the lungs every 4 (four) hours as needed. 6.7 g 0   ? cetirizine (ZYRTEC) 10 MG tablet Take 10 mg by mouth daily.     ? hydrochlorothiazide (MICROZIDE) 12.5 MG capsule TAKE 1 CAPSULE(12.5 MG) BY MOUTH DAILY (Patient taking differently: Take 12.5 mg by mouth daily.) 90 capsule 2   ? levETIRAcetam (KEPPRA) 1000 MG tablet Take 1,500 mg by mouth  daily.     ? levETIRAcetam (KEPPRA) 500 MG tablet Take 500 mg by mouth 2 (two) times daily.     ? LO LOESTRIN FE 1 MG-10 MCG / 10 MCG tablet TAKE 1 TABLET BY MOUTH DAILY 28 tablet 1   ? MAGNESIUM CL-CALCIUM CARBONATE PO Take by mouth.     ? Multiple Vitamins-Minerals (MULTIVITAMIN WITH MINERALS) tablet Take 1 tablet by mouth daily.     ? ondansetron (ZOFRAN) 4 MG tablet Take 4-8 mg by mouth every 6 (six) hours.     ? Probiotic Product (PROBIOTIC-10 PO) Take by mouth.     ? ?Scheduled: ? [START ON 10/10/2021] enoxaparin (LOVENOX) injection  40 mg Subcutaneous Q24H  ? levETIRAcetam  1,000 mg Oral q morning  ? levETIRAcetam  500 mg Oral QPM  ? loratadine  10 mg Oral Daily  ? pantoprazole (PROTONIX) IV  40 mg Intravenous Q24H  ? [START ON 10/10/2021] polyethylene glycol  17 g Oral Daily  ? senna-docusate  1 tablet Oral BID  ? sodium chloride flush  3 mL Intravenous Q12H  ? sodium chloride flush  3 mL Intravenous Q12H  ? ?Continuous: ? sodium chloride    ? cefTRIAXone (ROCEPHIN)  IV 2 g (10/08/21 2013)  ? dextrose 5 % and 0.9% NaCl 125 mL/hr  at 10/08/21 2245  ? ?ZOX:WRUEAVPRN:sodium chloride, acetaminophen **OR** acetaminophen, albuterol, bisacodyl, hydrALAZINE, ketorolac, ondansetron **OR** ondansetron (ZOFRAN) IV, oxyCODONE, polyethylene glycol, sodium chloride flush, traZODone ? ?Allergies  ?Allergen Reactions  ? Molds & Smuts   ? Other   ?  Cats,rabbits, dust, pollen  ? ? ? ?ROS:  ?A comprehensive review of systems was negative except for: Gastrointestinal: positive for abdominal pain and nausea ? ?Blood pressure 122/73, pulse 64, temperature 97.8 ?F (36.6 ?C), temperature source Oral, resp. rate 20, height 5' 4.5" (1.638 m), weight 72.2 kg, last menstrual period 09/23/2021, SpO2 98 %. ?Physical Exam ?Vitals reviewed.  ?Constitutional:   ?   Appearance: She is well-developed.  ?HENT:  ?   Head: Normocephalic.  ?Eyes:  ?   Extraocular Movements: Extraocular movements intact.  ?Cardiovascular:  ?   Rate and Rhythm: Normal rate and  regular rhythm.  ?Pulmonary:  ?   Breath sounds: Normal breath sounds.  ?Abdominal:  ?   General: There is no distension.  ?   Palpations: Abdomen is soft.  ?   Tenderness: There is abdominal tenderness in the right upper quadrant and epigastric area.  ?Skin: ?   General: Skin is warm.  ?Neurological:  ?   General: No focal deficit present.  ?   Mental Status: She is alert and oriented to person, place, and time.  ?Psychiatric:     ?   Mood and Affect: Mood normal.     ?   Behavior: Behavior normal.  ? ? ?Results: ?Results for orders placed or performed during the hospital encounter of 10/08/21 (from the past 48 hour(s))  ?Lipase, blood     Status: None  ? Collection Time: 10/08/21  2:41 PM  ?Result Value Ref Range  ? Lipase 33 11 - 51 U/L  ?  Comment: Performed at [facility], [address] [place], [place] [address]  ?Comprehensive metabolic panel     Status: Abnormal  ? Collection Time: 10/08/21  2:41 PM  ?Result Value Ref Range  ? Sodium 137 135 - 145 mmol/L  ? Potassium 2.8 (L) 3.5 - 5.1 mmol/L  ? Chloride 99 98 - 111 mmol/L  ? CO2 28 22 - 32 mmol/L  ? Glucose, Bld 135 (H) 70 - 99 mg/dL  ?  Comment: Glucose reference range applies only to samples taken after fasting for at least 8 hours.  ? BUN 14 6 - 20 mg/dL  ? Creatinine, Ser 0.97 0.44 - 1.00 mg/dL  ? Calcium 8.7 (L) 8.9 - 10.3 mg/dL  ? Total Protein 7.2 6.5 - 8.1 g/dL  ? Albumin 3.7 3.5 - 5.0 g/dL  ? AST 19 15 - 41 U/L  ? ALT 17 0 - 44 U/L  ? Alkaline Phosphatase 49 38 - 126 U/L  ? Total Bilirubin 0.6 0.3 - 1.2 mg/dL  ? GFR, Estimated >60 >60 mL/min  ?  Comment: (NOTE) ?Calculated using the CKD-EPI Creatinine Equation (2021) ?  ? Anion gap 10 5 - 15  ?  Comment: Performed at [facility], [address] [place], [place] [address]  ?CBC     Status: Abnormal  ? Collection Time: 10/08/21  2:41 PM  ?Result Value Ref Range  ? WBC 12.0 (H) 4.0 - 10.5 K/uL  ? RBC 4.45 3.87 - 5.11 MIL/uL  ? Hemoglobin 13.8 12.0 - 15.0 g/dL  ? HCT 40.6 36.0 - 46.0 %  ? MCV 91.2  80.0 - 100.0 fL  ? MCH 31.0 26.0 - 34.0 pg  ?  MCHC 34.0 30.0 - 36.0 g/dL  ? RDW 12.4 11.5 - 15.5 %  ? Platelets 341 150 - 400 K/uL  ? nRBC 0.0 0.0 - 0.2 %  ?  Comment: Performed at [facility], [address], [place], [place] [address]  ?Magnesium     Status: None  ? Collection Time: 10/08/21  2:41 PM  ?Result Value Ref Range  ? Magnesium 2.0 1.7 - 2.4 mg/dL  ?  Comment: Performed at [facility], [address], [place], [place] [address]  ?hCG, quantitative, pregnancy     Status: None  ? Collection Time: 10/08/21  3:13 PM  ?Result Value Ref Range  ? hCG, Beta Chain, Quant, S <1 <5 mIU/mL  ?  Comment:        ?  GEST. AGE      CONC.  (mIU/mL) ?  <=1 WEEK        5 - 50 ?    2 WEEKS       50 - 500 ?    3 WEEKS       100 - 10,000 ?    4 WEEKS     1,000 - 30,000 ?    5 WEEKS     3,500 - 115,000 ?  6-8 WEEKS     12,000 - 270,000 ?   12 WEEKS     15,000 - 220,000 ?       ?FEMALE AND NON-PREGNANT FEMALE: ?    LESS THAN 5 mIU/mL ?Performed at [facility], [address], [place], [place] [address] ?  ?Glucose, capillary     Status: Abnormal  ? Collection Time: 10/09/21 12:19 AM  ?Result Value Ref Range  ? Glucose-Capillary 110 (H) 70 - 99 mg/dL  ?  Comment: Glucose reference range applies only to samples taken after fasting for at least 8 hours.  ?Resp Panel by RT-PCR (Flu A&B, Covid) Nasopharyngeal Swab     Status: None  ? Collection Time: 10/09/21  5:01 AM  ? Specimen: Nasopharyngeal Swab; Nasopharyngeal(NP) swabs in vial transport medium  ?Result Value Ref Range  ? SARS Coronavirus 2 by RT PCR NEGATIVE NEGATIVE  ?  Comment: (NOTE) ?SARS-CoV-2 target nucleic acids are NOT DETECTED. ? ?The SARS-CoV-2 RNA is generally detectable in upper respiratory ?specimens during the acute phase of infection. The lowest ?concentration of SARS-CoV-2 viral copies this assay can detect is ?138 copies/mL. A negative result does not preclude SARS-Cov-2 ?infection and should not be used as the sole basis for treatment or ?other patient  management decisions. A negative result may occur with  ?improper specimen collection/handling, submission of specimen other ?than nasopharyngeal swab, presence of viral mutation(s) within the ?areas targeted by

## 2021-10-10 ENCOUNTER — Encounter (HOSPITAL_COMMUNITY)
Admission: EM | Disposition: A | Discharge status: Home / Self Care | Payer: Self-pay | Source: Home / Self Care | Attending: Emergency Medicine

## 2021-10-10 ENCOUNTER — Encounter (HOSPITAL_COMMUNITY): Payer: Self-pay | Admitting: Family Medicine

## 2021-10-10 ENCOUNTER — Inpatient Hospital Stay (HOSPITAL_COMMUNITY): Payer: BC Managed Care – PPO | Admitting: Anesthesiology

## 2021-10-10 ENCOUNTER — Other Ambulatory Visit: Payer: Self-pay

## 2021-10-10 DIAGNOSIS — K810 Acute cholecystitis: Secondary | ICD-10-CM | POA: Diagnosis not present

## 2021-10-10 HISTORY — PX: CHOLECYSTECTOMY: SHX55

## 2021-10-10 LAB — CBC WITH DIFFERENTIAL/PLATELET
Abs Immature Granulocytes: 0.02 10*3/uL (ref 0.00–0.07)
Basophils Absolute: 0 10*3/uL (ref 0.0–0.1)
Basophils Relative: 1 %
Eosinophils Absolute: 0.4 10*3/uL (ref 0.0–0.5)
Eosinophils Relative: 5 %
HCT: 33 % — ABNORMAL LOW (ref 36.0–46.0)
Hemoglobin: 10.8 g/dL — ABNORMAL LOW (ref 12.0–15.0)
Immature Granulocytes: 0 %
Lymphocytes Relative: 28 %
Lymphs Abs: 2.1 10*3/uL (ref 0.7–4.0)
MCH: 30.1 pg (ref 26.0–34.0)
MCHC: 32.7 g/dL (ref 30.0–36.0)
MCV: 91.9 fL (ref 80.0–100.0)
Monocytes Absolute: 0.6 10*3/uL (ref 0.1–1.0)
Monocytes Relative: 8 %
Neutro Abs: 4.2 10*3/uL (ref 1.7–7.7)
Neutrophils Relative %: 58 %
Platelets: 285 10*3/uL (ref 150–400)
RBC: 3.59 MIL/uL — ABNORMAL LOW (ref 3.87–5.11)
RDW: 12.2 % (ref 11.5–15.5)
WBC: 7.2 10*3/uL (ref 4.0–10.5)
nRBC: 0 % (ref 0.0–0.2)

## 2021-10-10 LAB — COMPREHENSIVE METABOLIC PANEL
ALT: 13 U/L (ref 0–44)
AST: 13 U/L — ABNORMAL LOW (ref 15–41)
Albumin: 2.7 g/dL — ABNORMAL LOW (ref 3.5–5.0)
Alkaline Phosphatase: 36 U/L — ABNORMAL LOW (ref 38–126)
Anion gap: 8 (ref 5–15)
BUN: 6 mg/dL (ref 6–20)
CO2: 23 mmol/L (ref 22–32)
Calcium: 7.5 mg/dL — ABNORMAL LOW (ref 8.9–10.3)
Chloride: 109 mmol/L (ref 98–111)
Creatinine, Ser: 0.71 mg/dL (ref 0.44–1.00)
GFR, Estimated: >60 mL/min (ref >60)
Glucose, Bld: 99 mg/dL (ref 70–99)
Potassium: 3.3 mmol/L — ABNORMAL LOW (ref 3.5–5.1)
Sodium: 140 mmol/L (ref 135–145)
Total Bilirubin: 0.4 mg/dL (ref 0.3–1.2)
Total Protein: 5.7 g/dL — ABNORMAL LOW (ref 6.5–8.1)

## 2021-10-10 LAB — GLUCOSE, CAPILLARY
Glucose-Capillary: 125 mg/dL — ABNORMAL HIGH (ref 70–99)
Glucose-Capillary: 143 mg/dL — ABNORMAL HIGH (ref 70–99)
Glucose-Capillary: 97 mg/dL (ref 70–99)

## 2021-10-10 LAB — MAGNESIUM: Magnesium: 1.9 mg/dL (ref 1.7–2.4)

## 2021-10-10 SURGERY — LAPAROSCOPIC CHOLECYSTECTOMY
Anesthesia: General | Site: Abdomen

## 2021-10-10 MED ORDER — MEPERIDINE HCL 50 MG/ML IJ SOLN: 6.2500 mg | INTRAMUSCULAR | Status: DC | PRN

## 2021-10-10 MED ORDER — ONDANSETRON HCL 4 MG/2ML IJ SOLN
INTRAMUSCULAR | Status: DC | PRN
  Administered 2021-10-10: 4 mg via INTRAVENOUS

## 2021-10-10 MED ORDER — SODIUM CHLORIDE 0.9 % IV SOLN
INTRAVENOUS | Status: AC
  Filled 2021-10-10: qty 2

## 2021-10-10 MED ORDER — CHLORHEXIDINE GLUCONATE CLOTH 2 % EX PADS: 6.0000 | MEDICATED_PAD | Freq: Once | CUTANEOUS | Status: DC

## 2021-10-10 MED ORDER — DEXAMETHASONE SODIUM PHOSPHATE 10 MG/ML IJ SOLN
INTRAMUSCULAR | Status: AC
  Filled 2021-10-10: qty 1

## 2021-10-10 MED ORDER — LACTATED RINGERS IV SOLN: INTRAVENOUS | Status: DC

## 2021-10-10 MED ORDER — BUPIVACAINE HCL (PF) 0.5 % IJ SOLN
INTRAMUSCULAR | Status: DC | PRN
Start: 2021-10-10 — End: 2021-10-10
  Administered 2021-10-10: 14 mL

## 2021-10-10 MED ORDER — HYDROMORPHONE HCL 1 MG/ML IJ SOLN
0.2500 mg | INTRAMUSCULAR | Status: DC | PRN
  Administered 2021-10-10: 0.5 mg via INTRAVENOUS
  Filled 2021-10-10: qty 0.5

## 2021-10-10 MED ORDER — CHLORHEXIDINE GLUCONATE CLOTH 2 % EX PADS
6.0000 | MEDICATED_PAD | Freq: Once | CUTANEOUS | Status: DC
  Administered 2021-10-10: 6 via TOPICAL

## 2021-10-10 MED ORDER — ONDANSETRON HCL 4 MG/2ML IJ SOLN
INTRAMUSCULAR | Status: AC
  Filled 2021-10-10: qty 2

## 2021-10-10 MED ORDER — PROPOFOL 10 MG/ML IV BOLUS
INTRAVENOUS | Status: DC | PRN
  Administered 2021-10-10: 140 mg via INTRAVENOUS
  Administered 2021-10-10: 60 mg via INTRAVENOUS

## 2021-10-10 MED ORDER — LIDOCAINE HCL (PF) 2 % IJ SOLN
INTRAMUSCULAR | Status: AC
  Filled 2021-10-10: qty 5

## 2021-10-10 MED ORDER — MIDAZOLAM HCL 5 MG/5ML IJ SOLN
INTRAMUSCULAR | Status: DC | PRN
  Administered 2021-10-10 (×2): 1 mg via INTRAVENOUS

## 2021-10-10 MED ORDER — SODIUM CHLORIDE 0.9 % IR SOLN
Status: DC | PRN
  Administered 2021-10-10: 1000 mL

## 2021-10-10 MED ORDER — POTASSIUM CHLORIDE CRYS ER 20 MEQ PO TBCR
40.0000 meq | EXTENDED_RELEASE_TABLET | Freq: Once | ORAL | Status: AC
  Administered 2021-10-10: 40 meq via ORAL
  Filled 2021-10-10 (×2): qty 2

## 2021-10-10 MED ORDER — FENTANYL CITRATE (PF) 100 MCG/2ML IJ SOLN
INTRAMUSCULAR | Status: DC | PRN
  Administered 2021-10-10 (×2): 50 ug via INTRAVENOUS

## 2021-10-10 MED ORDER — BUPIVACAINE HCL (PF) 0.5 % IJ SOLN
INTRAMUSCULAR | Status: AC
  Filled 2021-10-10: qty 30

## 2021-10-10 MED ORDER — CHLORHEXIDINE GLUCONATE 0.12 % MT SOLN
15.0000 mL | Freq: Once | OROMUCOSAL | Status: AC
  Administered 2021-10-10: 15 mL via OROMUCOSAL

## 2021-10-10 MED ORDER — ONDANSETRON HCL 4 MG/2ML IJ SOLN: 4.0000 mg | Freq: Once | INTRAMUSCULAR | Status: DC | PRN

## 2021-10-10 MED ORDER — HEMOSTATIC AGENTS (NO CHARGE) OPTIME
TOPICAL | Status: DC | PRN
  Administered 2021-10-10: 1 via TOPICAL

## 2021-10-10 MED ORDER — MIDAZOLAM HCL 2 MG/2ML IJ SOLN
INTRAMUSCULAR | Status: AC
  Filled 2021-10-10: qty 2

## 2021-10-10 MED ORDER — SUCCINYLCHOLINE CHLORIDE 200 MG/10ML IV SOSY
PREFILLED_SYRINGE | INTRAVENOUS | Status: AC
  Filled 2021-10-10: qty 10

## 2021-10-10 MED ORDER — SUGAMMADEX SODIUM 200 MG/2ML IV SOLN
INTRAVENOUS | Status: DC | PRN
  Administered 2021-10-10: 150 mg via INTRAVENOUS

## 2021-10-10 MED ORDER — ORAL CARE MOUTH RINSE: 15.0000 mL | Freq: Once | OROMUCOSAL | Status: AC

## 2021-10-10 MED ORDER — FENTANYL CITRATE (PF) 100 MCG/2ML IJ SOLN
INTRAMUSCULAR | Status: AC
  Filled 2021-10-10: qty 2

## 2021-10-10 MED ORDER — DEXAMETHASONE SODIUM PHOSPHATE 4 MG/ML IJ SOLN
INTRAMUSCULAR | Status: DC | PRN
  Administered 2021-10-10: 10 mg via INTRAVENOUS

## 2021-10-10 MED ORDER — ROCURONIUM BROMIDE 100 MG/10ML IV SOLN
INTRAVENOUS | Status: DC | PRN
  Administered 2021-10-10: 50 mg via INTRAVENOUS

## 2021-10-10 MED ORDER — PROPOFOL 10 MG/ML IV BOLUS
INTRAVENOUS | Status: AC
Start: 2021-10-10 — End: ?
  Filled 2021-10-10: qty 20

## 2021-10-10 MED ORDER — SODIUM CHLORIDE 0.9 % IV SOLN: 2.0000 g | INTRAVENOUS | Status: DC

## 2021-10-10 MED ORDER — MORPHINE SULFATE (PF) 2 MG/ML IV SOLN
2.0000 mg | INTRAVENOUS | Status: DC | PRN
  Administered 2021-10-10 – 2021-10-11 (×3): 2 mg via INTRAVENOUS
  Filled 2021-10-10 (×3): qty 1

## 2021-10-10 MED ORDER — ROCURONIUM BROMIDE 10 MG/ML (PF) SYRINGE
PREFILLED_SYRINGE | INTRAVENOUS | Status: AC
  Filled 2021-10-10: qty 10

## 2021-10-10 SURGICAL SUPPLY — 46 items
APPLIER CLIP ROT 10 11.4 M/L (STAPLE) ×2
BAG RETRIEVAL 10 (BASKET) ×1
BLADE SURG 15 STRL LF DISP TIS (BLADE) ×1 IMPLANT
BLADE SURG 15 STRL SS (BLADE) ×1
CHLORAPREP W/TINT 26 (MISCELLANEOUS) ×2 IMPLANT
CLIP APPLIE ROT 10 11.4 M/L (STAPLE) ×1 IMPLANT
CLOTH BEACON ORANGE TIMEOUT ST (SAFETY) ×2 IMPLANT
COVER LIGHT HANDLE STERIS (MISCELLANEOUS) ×4 IMPLANT
DECANTER SPIKE VIAL GLASS SM (MISCELLANEOUS) ×2 IMPLANT
DERMABOND ADVANCED (GAUZE/BANDAGES/DRESSINGS) ×1
DERMABOND ADVANCED .7 DNX12 (GAUZE/BANDAGES/DRESSINGS) ×1 IMPLANT
ELECT REM PT RETURN 9FT ADLT (ELECTROSURGICAL) ×2
ELECTRODE REM PT RTRN 9FT ADLT (ELECTROSURGICAL) ×1 IMPLANT
GAUZE 4X4 16PLY ~~LOC~~+RFID DBL (SPONGE) ×2 IMPLANT
GLOVE SURG LTX SZ6.5 (GLOVE) ×1 IMPLANT
GLOVE SURG POLYISO LF SZ7 (GLOVE) ×2 IMPLANT
GLOVE SURG POLYISO LF SZ8 (GLOVE) ×1 IMPLANT
GLOVE SURG UNDER POLY LF SZ7 (GLOVE) ×8 IMPLANT
GLOVE SURG UNDER POLY LF SZ8.5 (GLOVE) ×1 IMPLANT
GOWN STRL REUS W/TWL LRG LVL3 (GOWN DISPOSABLE) ×6 IMPLANT
GOWN STRL REUS W/TWL XL LVL3 (GOWN DISPOSABLE) ×1 IMPLANT
HEMOSTAT SNOW SURGICEL 2X4 (HEMOSTASIS) ×2 IMPLANT
INST SET LAPROSCOPIC AP (KITS) ×2 IMPLANT
KIT TURNOVER KIT A (KITS) ×2 IMPLANT
MANIFOLD NEPTUNE II (INSTRUMENTS) ×2 IMPLANT
NDL HYPO 25X1 1.5 SAFETY (NEEDLE) IMPLANT
NDL INSUFFLATION 14GA 120MM (NEEDLE) ×1 IMPLANT
NEEDLE HYPO 25X1 1.5 SAFETY (NEEDLE) ×4 IMPLANT
NEEDLE INSUFFLATION 14GA 120MM (NEEDLE) ×2 IMPLANT
NS IRRIG 1000ML POUR BTL (IV SOLUTION) ×2 IMPLANT
PACK LAP CHOLE LZT030E (CUSTOM PROCEDURE TRAY) ×2 IMPLANT
PAD ARMBOARD 7.5X6 YLW CONV (MISCELLANEOUS) ×2 IMPLANT
PENCIL HANDSWITCHING (ELECTRODE) ×1 IMPLANT
SET BASIN LINEN APH (SET/KITS/TRAYS/PACK) ×2 IMPLANT
SET TUBE SMOKE EVAC HIGH FLOW (TUBING) ×2 IMPLANT
SLEEVE ENDOPATH XCEL 5M (ENDOMECHANICALS) ×2 IMPLANT
SUT MNCRL AB 4-0 PS2 18 (SUTURE) ×4 IMPLANT
SUT VICRYL 0 UR6 27IN ABS (SUTURE) ×3 IMPLANT
SYR 30ML LL (SYRINGE) ×1 IMPLANT
SYS BAG RETRIEVAL 10MM (BASKET) ×1
SYSTEM BAG RETRIEVAL 10MM (BASKET) ×1 IMPLANT
TROCAR ENDO BLADELESS 11MM (ENDOMECHANICALS) ×2 IMPLANT
TROCAR XCEL NON-BLD 5MMX100MML (ENDOMECHANICALS) ×2 IMPLANT
TROCAR XCEL UNIV SLVE 11M 100M (ENDOMECHANICALS) ×2 IMPLANT
TUBE CONNECTING 12X1/4 (SUCTIONS) ×2 IMPLANT
WARMER LAPAROSCOPE (MISCELLANEOUS) ×2 IMPLANT

## 2021-10-10 NOTE — Anesthesia Procedure Notes (Signed)
Procedure Name: Intubation ?Date/Time: 10/10/2021 8:02 AM ?Performed by: Sonda Primes, CRNA ?Pre-anesthesia Checklist: Patient identified, Emergency Drugs available, Suction available and Patient being monitored ?Patient Re-evaluated:Patient Re-evaluated prior to induction ?Oxygen Delivery Method: Circle System Utilized ?Preoxygenation: Pre-oxygenation with 100% oxygen ?Induction Type: IV induction ?Ventilation: Mask ventilation without difficulty ?Laryngoscope Size: Glidescope, 3 and 4 ?Grade View: Grade II ?Tube type: Oral ?Tube size: 7.0 mm ?Number of attempts: 2 ?Airway Equipment and Method: Stylet and Oral airway ?Placement Confirmation: ETT inserted through vocal cords under direct vision, positive ETCO2 and breath sounds checked- equal and bilateral ?Tube secured with: Tape ?Dental Injury: Teeth and Oropharynx as per pre-operative assessment  ?Comments: First intubation via glidescope +ETCO2, gurgling noted from airway, balloon would not hold air, ETT replaced Glidescope per Dr. Alva Garnet.  ? ? ? ? ?

## 2021-10-10 NOTE — Progress Notes (Signed)
[facility] ? ?Wants to stay overnight given her pain. Will dc tomorrow. ? ?Work note given. ? ?Algis Greenhouse, MD ?[facility] ?[address] ?[place], [place] [address] ?[phone] (office) ? ?

## 2021-10-10 NOTE — Anesthesia Postprocedure Evaluation (Signed)
Anesthesia Post Note ? ?Patient: Melissa Skinner ? ?Procedure(s) Performed: LAPAROSCOPIC CHOLECYSTECTOMY (Abdomen) ? ?Patient location during evaluation: PACU ?Anesthesia Type: General ?Level of consciousness: awake and alert and oriented ?Pain management: pain level controlled ?Vital Signs Assessment: post-procedure vital signs reviewed and stable ?Respiratory status: spontaneous breathing, nonlabored ventilation and respiratory function stable ?Cardiovascular status: blood pressure returned to baseline and stable ?Postop Assessment: no apparent nausea or vomiting ?Anesthetic complications: no ? ? ?No notable events documented. ? ? ?Last Vitals:  ?Vitals:  ? 10/10/21 0930 10/10/21 0945  ?BP: (!) 118/99 131/78  ?Pulse: 74 71  ?Resp: 20 14  ?Temp:    ?SpO2: 97% 93%  ?  ?Last Pain:  ?Vitals:  ? 10/10/21 1045  ?TempSrc:   ?PainSc: 7   ? ? ?  ?  ?  ?  ?  ?  ? ?Nilay Mangrum C Maryclaire Stoecker ? ? ? ? ?

## 2021-10-10 NOTE — Progress Notes (Signed)
[facility] ? ?Updated patient's husband. May get to go home later today if doing ok.  ? ?Algis Greenhouse, MD ?[facility] ?[address] ?[place], [place] [address] ?[phone] (office) ? ?

## 2021-10-10 NOTE — Anesthesia Preprocedure Evaluation (Addendum)
Anesthesia Evaluation  ?Patient identified by MRN, date of birth, ID band ?Patient awake ? ? ? ?Reviewed: ?Allergy & Precautions, NPO status , Patient's Chart, lab work & pertinent test results ? ?Airway ?Mallampati: III ? ?TM Distance: >3 FB ?Neck ROM: Full ? ? ? Dental ? ?(+) Dental Advisory Given, Teeth Intact ?  ?Pulmonary ?asthma ,  ?  ?Pulmonary exam normal ?breath sounds clear to auscultation ? ? ? ? ? ? Cardiovascular ?Exercise Tolerance: Good ?hypertension, Pt. on medications ?Normal cardiovascular exam ?Rhythm:Regular Rate:Normal ? ?Repaired PDA ?  ?Neuro/Psych ?Seizures -, Well Controlled,   Neuromuscular disease negative psych ROS  ? GI/Hepatic ?negative GI ROS, Neg liver ROS,   ?Endo/Other  ?negative endocrine ROS ? Renal/GU ?negative Renal ROS  ?negative genitourinary ?  ?Musculoskeletal ?negative musculoskeletal ROS ?(+)  ? Abdominal ?  ?Peds ?negative pediatric ROS ?(+)  Hematology ?negative hematology ROS ?(+)   ?Anesthesia Other Findings ? ? Reproductive/Obstetrics ?negative OB ROS ? ?  ? ? ? ? ? ? ? ? ? ? ? ? ? ?  ?  ? ? ? ? ? ? ? ?Anesthesia Physical ?Anesthesia Plan ? ?ASA: 2 ? ?Anesthesia Plan: General  ? ?Post-op Pain Management: Dilaudid IV  ? ?Induction: Intravenous ? ?PONV Risk Score and Plan: 4 or greater and Ondansetron, Dexamethasone and Midazolam ? ?Airway Management Planned: Oral ETT ? ?Additional Equipment:  ? ?Intra-op Plan:  ? ?Post-operative Plan: Extubation in OR ? ?Informed Consent: I have reviewed the patients History and Physical, chart, labs and discussed the procedure including the risks, benefits and alternatives for the proposed anesthesia with the patient or authorized representative who has indicated his/her understanding and acceptance.  ? ? ? ?Dental advisory given ? ?Plan Discussed with: CRNA and Surgeon ? ?Anesthesia Plan Comments:   ? ? ? ? ? ? ?Anesthesia Quick Evaluation ? ?

## 2021-10-10 NOTE — Progress Notes (Signed)
?  Transition of Care (TOC) Screening Note ? ? ?Patient Details  ?Name: Melissa Skinner ?Date of Birth: Feb 17, 1976 ? ? ?Transition of Care (TOC) CM/SW Contact:    ?Iona Beard, LCSWA ?Phone Number: ?10/10/2021, 9:45 AM ? ? ? ?Transition of Care Department [facility]) has reviewed patient and no TOC needs have been identified at this time. We will continue to monitor patient advancement through interdisciplinary progression rounds. If new patient transition needs arise, please place a TOC consult. ?  ?

## 2021-10-10 NOTE — Discharge Instructions (Signed)
Discharge Laparoscopic Surgery Instructions:  Common Complaints: Right shoulder pain is common after laparoscopic surgery. This is secondary to the gas used in the surgery being trapped under the diaphragm.  Walk to help your body absorb the gas. This will improve in a few days. Pain at the port sites are common, especially the larger port sites. This will improve with time.  Some nausea is common and poor appetite. The main goal is to stay hydrated the first few days after surgery.   Diet/ Activity: Diet as tolerated. You may not have an appetite, but it is important to stay hydrated. Drink 64 ounces of water a day. Your appetite will return with time.  Shower per your regular routine daily.  Do not take hot showers. Take warm showers that are less than 10 minutes. Rest and listen to your body, but do not remain in bed all day.  Walk everyday for at least 15-20 minutes. Deep cough and move around every 1-2 hours in the first few days after surgery.  Do not lift > 10 lbs, perform excessive bending, pushing, pulling, squatting for 1-2 weeks after surgery.  Do not pick at the dermabond glue on your incision sites.  This glue film will remain in place for 1-2 weeks and will start to peel off.  Do not place lotions or balms on your incision unless instructed to specifically by Dr. Zaquan Duffner.   Pain Expectations and Narcotics: -After surgery you will have pain associated with your incisions and this is normal. The pain is muscular and nerve pain, and will get better with time. -You are encouraged and expected to take non narcotic medications like tylenol and ibuprofen (when able) to treat pain as multiple modalities can aid with pain treatment. -Narcotics are only used when pain is severe or there is breakthrough pain. -You are not expected to have a pain score of 0 after surgery, as we cannot prevent pain. A pain score of 3-4 that allows you to be functional, move, walk, and tolerate some activity is  the goal. The pain will continue to improve over the days after surgery and is dependent on your surgery. -Due to [place] law, we are only able to give a certain amount of pain medication to treat post operative pain, and we only give additional narcotics on a patient by patient basis.  -For most laparoscopic surgery, studies have shown that the majority of patients only need 10-15 narcotic pills, and for open surgeries most patients only need 15-20.   -Having appropriate expectations of pain and knowledge of pain management with non narcotics is important as we do not want anyone to become addicted to narcotic pain medication.  -Using ice packs in the first 48 hours and heating pads after 48 hours, wearing an abdominal binder (when recommended), and using over the counter medications are all ways to help with pain management.   -Simple acts like meditation and mindfulness practices after surgery can also help with pain control and research has proven the benefit of these practices.  Medication: Take tylenol and ibuprofen as needed for pain control, alternating every 4-6 hours.  Example:  Tylenol 1000mg @ 6am, 12noon, 6pm, 12midnight (Do not exceed 4000mg of tylenol a day). Ibuprofen 800mg @ 9am, 3pm, 9pm, 3am (Do not exceed 3600mg of ibuprofen a day).  Take Roxicodone for breakthrough pain every 4 hours.  Take Colace for constipation related to narcotic pain medication. If you do not have a bowel movement in 2 days, take Miralax   over the counter.  Drink plenty of water to also prevent constipation.   Contact Information: If you have questions or concerns, please call our office, [phone], Monday- Thursday 8AM-5PM and Friday 8AM-12Noon.  If it is after hours or on the weekend, please call [facility] Main Number, [phone], [phone], and ask to speak to the surgeon on call for Dr. Deshanae Lindo at [place].   

## 2021-10-10 NOTE — Interval H&P Note (Signed)
History and Physical Interval Note: ? ?10/10/2021 ?7:35 AM ? ?Melissa Skinner  has presented today for surgery, with the diagnosis of acute cholecystitis.  The various methods of treatment have been discussed with the patient and family. After consideration of risks, benefits and other options for treatment, the patient has consented to  Procedure(s): ?LAPAROSCOPIC CHOLECYSTECTOMY (N/A) as a surgical intervention.  The patient's history has been reviewed, patient examined, no change in status, stable for surgery.  I have reviewed the patient's chart and labs.  Questions were answered to the patient's satisfaction.   ? ? ?Lucretia Roers ? ? ?

## 2021-10-10 NOTE — Transfer of Care (Signed)
Immediate Anesthesia Transfer of Care Note ? ?Patient: Melissa Skinner ? ?Procedure(s) Performed: LAPAROSCOPIC CHOLECYSTECTOMY (Abdomen) ? ?Patient Location: PACU ? ?Anesthesia Type:General ? ?Level of Consciousness: awake, alert , oriented and patient cooperative ? ?Airway & Oxygen Therapy: Patient Spontanous Breathing ? ?Post-op Assessment: Report given to RN and Post -op Vital signs reviewed and stable ? ?Post vital signs: Reviewed and stable ? ?Last Vitals:  ?Vitals Value Taken Time  ?BP 117/91 10/10/21 0921  ?Temp    ?Pulse    ?Resp 21 10/10/21 0922  ?SpO2    ?Vitals shown include unvalidated device data. ? ?Last Pain:  ?Vitals:  ? 10/10/21 0549  ?TempSrc: Oral  ?PainSc:   ?   ? ?  ? ?Complications: No notable events documented. ?

## 2021-10-10 NOTE — Op Note (Addendum)
Operative Note ?  ?Preoperative Diagnosis: Acute cholecystitis  ?  ?Postoperative Diagnosis: Same ?  ?Procedure(s) Performed: Laparoscopic cholecystectomy ?  ?Surgeon: Lanell Matar. Constance Haw, MD ?  ?Assistants: No qualified resident was available ?  ?Anesthesia: General endotracheal ?  ?Anesthesiologist: Denese Killings, MD  ?  ?Specimens: Gallbladder  ?  ?Estimated Blood Loss: Minimal  ?  ?Blood Replacement: None  ?  ?Complications: None  ?  ?Operative Findings: Distended gallbladder with stones and edema  ?  ?Procedure: The patient was taken to the operating room and placed supine. General endotracheal anesthesia was induced. Intravenous antibiotics were administered per protocol. An orogastric tube positioned to decompress the stomach. The abdomen was prepared and draped in the usual sterile fashion.  ?  ?A supraumbilical incision was made and a Veress technique was utilized to achieve pneumoperitoneum to 15 mmHg with carbon dioxide. A 11 mm optiview port was placed through the supraumbilical region, and a 10 mm 0-degree operative laparoscope was introduced. The area underlying the trocar and Veress needle were inspected and without evidence of injury.  Remaining trocars were placed under direct vision. Two 5 mm ports were placed in the right abdomen, between the anterior axillary and midclavicular line.  A final 11 mm port was placed through the mid-epigastrium, near the falciform ligament.  ?  ?The gallbladder was decompressed. The fundus was elevated cephalad and the infundibulum was retracted to the patient's right. The gallbladder/cystic duct junction was skeletonized. The cystic artery noted in the triangle of Calot and was also skeletonized.  We then continued liberal medial and lateral dissection until the critical view of safety was achieved.  ?  ?The cystic duct and cystic artery were triply clipped and divided. The gallbladder was then dissected from the liver bed with electrocautery. The specimen was  placed in an Endopouch and was retrieved through the epigastric site. ?  ?Final inspection revealed acceptable hemostasis. Surgical SNOW was placed in the gallbladder bed.  Trocars were removed and pneumoperitoneum was released.  0 Vicryl fascial sutures were used to close the epigastric and umbilical port sites. Skin incisions were closed with 4-0 Monocryl subcuticular sutures and Dermabond. The patient was awakened from anesthesia and extubated without complication.  ?  ?Curlene Labrum, MD ?[facility] ?[address], [place] [address] ?[phone] (office) ?  ?

## 2021-10-11 LAB — GLUCOSE, CAPILLARY
Glucose-Capillary: 99 mg/dL (ref 70–99)
Glucose-Capillary: 108 mg/dL — ABNORMAL HIGH (ref 70–99)
Glucose-Capillary: 117 mg/dL — ABNORMAL HIGH (ref 70–99)

## 2021-10-11 LAB — URINE CULTURE: Culture: <10000 — AB

## 2021-10-11 LAB — SURGICAL PATHOLOGY

## 2021-10-11 MED ORDER — OXYCODONE HCL 5 MG PO TABS: 5.0000 mg | ORAL_TABLET | ORAL | 0 refills | Status: DC | PRN

## 2021-10-11 MED ORDER — ONDANSETRON HCL 4 MG PO TABS: 4.0000 mg | ORAL_TABLET | Freq: Four times a day (QID) | ORAL | 0 refills | Status: DC | PRN

## 2021-10-11 NOTE — Discharge Summary (Signed)
Physician Discharge Summary  ?Patient ID: ?Melissa Skinner ?MRN: [id number] ?DOB/AGE: 09/17/1975 46 y.o. ? ?Admit date: 10/08/2021 ?Discharge date: 10/11/2021 ? ?Admission Diagnoses: Acute cholecystitis  ? ?Discharge Diagnoses:  ?Principal Problem: ?  Acute cholecystitis ?Active Problems: ?  Menorrhagia with irregular cycle ?  Hypertension ?  Partial Seizure (HCC) ?  Constipation ?  Hypokalemia ?  Suprapubic abdominal pain ? ? ?Discharged Condition: good ? ?Hospital Course: Melissa Skinner is a 46 yo with acute cholecystitis that was admitted to the hospital 3/18 late and had surgery on 3/20, Monday. She was kept overnight again due to pain control issues. She was doing well this Am. Having less pain, tolerating some food. She is ready for dc home.  ? ?Consults:  hospitalist admission- taken over by surgery ? ?Significant Diagnostic Studies: CT -acute cholecystitis  ? ?Treatments: IV antibiotics, Surgery 3/20 Laparoscopic cholecystectomy  ? ?Discharge Exam: ?Blood pressure 137/82, pulse 78, temperature 98.7 ?F (37.1 ?C), temperature source Oral, resp. rate 18, height 5' 4.5" (1.638 m), weight 72.2 kg, last menstrual period 09/23/2021, SpO2 97 %. ?General appearance: alert and no distress ?Resp: normal work of breathing ?GI: soft, nondistended, appropriately tender, port sites/ c/d/I with dermabond ? ?Disposition: Discharge disposition: 01-Home or Self Care ? ? ? ? ? ? ?Discharge Instructions   ? ? Call MD for:  difficulty breathing, headache or visual disturbances   Complete by: As directed ?  ? Call MD for:  extreme fatigue   Complete by: As directed ?  ? Call MD for:  persistant dizziness or light-headedness   Complete by: As directed ?  ? Call MD for:  persistant nausea and vomiting   Complete by: As directed ?  ? Call MD for:  redness, tenderness, or signs of infection (pain, swelling, redness, odor or green/yellow discharge around incision site)   Complete by: As directed ?  ? Call MD for:  severe uncontrolled pain    Complete by: As directed ?  ? Call MD for:  temperature >100.4   Complete by: As directed ?  ? Diet - low sodium heart healthy   Complete by: As directed ?  ? Increase activity slowly   Complete by: As directed ?  ? ?  ? ?Allergies as of 10/11/2021   ? ?   Reactions  ? Molds & Smuts   ? Other   ? Cats,rabbits, dust, pollen  ? ?  ? ?  ?Medication List  ?  ? ?TAKE these medications   ? ?albuterol 108 (90 Base) MCG/ACT inhaler ?Commonly known as: VENTOLIN HFA ?Inhale 2 puffs into the lungs every 4 (four) hours as needed. ?  ?cetirizine 10 MG tablet ?Commonly known as: ZYRTEC ?Take 10 mg by mouth daily. ?  ?hydrochlorothiazide 12.5 MG capsule ?Commonly known as: MICROZIDE ?TAKE 1 CAPSULE(12.5 MG) BY MOUTH DAILY ?What changed: See the new instructions. ?  ?levETIRAcetam 1000 MG tablet ?Commonly known as: KEPPRA ?Take 1,500 mg by mouth daily. ?  ?Lo Loestrin Fe 1 MG-10 MCG / 10 MCG tablet ?Generic drug: Norethindrone-Ethinyl Estradiol-Fe Biphas ?TAKE 1 TABLET BY MOUTH DAILY ?  ?multivitamin with minerals tablet ?Take 1 tablet by mouth daily. ?  ?AIRBORNE PO ?Take by mouth. ?  ?ondansetron 4 MG tablet ?Commonly known as: ZOFRAN ?Take 1 tablet (4 mg total) by mouth every 6 (six) hours as needed for nausea. ?What changed:  ?how much to take ?when to take this ?reasons to take this ?  ?oxyCODONE 5 MG immediate release tablet ?Commonly known  as: Oxy IR/ROXICODONE ?Take 1 tablet (5 mg total) by mouth every 4 (four) hours as needed for severe pain or breakthrough pain. ?  ?PROBIOTIC-10 PO ?Take by mouth. ?  ? ?  ? ? Follow-up Information   ? ? Lucretia Roers, MD Follow up on 10/24/2021.   ?Specialty: General Surgery ?Why: post op phone call ?Contact information: ?[address] ?[place] [address] ?[phone] ? ? ?  ?  ? ?  ?  ? ?  ? ?Future Appointments  ?Date Time Provider Department Center  ?10/24/2021  2:45 PM Lucretia Roers, MD [facility] None  ? ? ? ?Signed: ?Lucretia Roers ?10/11/2021, 9:31 AM ? ? ?

## 2021-10-13 ENCOUNTER — Encounter (HOSPITAL_COMMUNITY): Payer: Self-pay | Admitting: General Surgery

## 2021-10-24 ENCOUNTER — Ambulatory Visit (INDEPENDENT_AMBULATORY_CARE_PROVIDER_SITE_OTHER): Payer: BC Managed Care – PPO | Admitting: General Surgery

## 2021-10-24 DIAGNOSIS — K810 Acute cholecystitis: Secondary | ICD-10-CM

## 2021-10-24 NOTE — Progress Notes (Signed)
[facility] ? ?I am calling the patient for post operative evaluation. This is not a billable encounter as it is under the [place] charges for the surgery.  The patient had a laparoscopic on 10/10/21. The patient reports that she is doing good but just sore but this is improve. The are tolerating a diet, having good pain control, and having Bms but not as regular as she normally did. She says this is improving.  The incisions are healing fine. The patient has no concerns.  ?She is a little tired but nothing major.  ? ?Pathology: ?FINAL MICROSCOPIC DIAGNOSIS:  ? ?A. GALLBLADDER, CHOLECYSTECTOMY:  ?- Acute and chronic cholecystitis with cholelithiasis  ?- Benign lymph node  ? ?Will see the patient PRN.  ?Diet and activity as tolerated ?Fiber and water recommended for Bms.  ? ?Curlene Labrum, MD ?[facility] ?[place], [place] [address] ?[phone] (office)  ?

## 2021-11-19 ENCOUNTER — Other Ambulatory Visit: Payer: Self-pay | Admitting: Adult Health

## 2021-12-01 ENCOUNTER — Ambulatory Visit (INDEPENDENT_AMBULATORY_CARE_PROVIDER_SITE_OTHER): Payer: BC Managed Care – PPO | Admitting: Adult Health

## 2021-12-01 ENCOUNTER — Encounter: Payer: Self-pay | Admitting: Adult Health

## 2021-12-01 ENCOUNTER — Other Ambulatory Visit (HOSPITAL_COMMUNITY)
Admission: RE | Admit: 2021-12-01 | Discharge: 2021-12-01 | Disposition: A | Discharge status: Home / Self Care | Payer: BC Managed Care – PPO | Source: Ambulatory Visit | Attending: Adult Health | Admitting: Adult Health

## 2021-12-01 VITALS — BP 142/92 | HR 73 | Ht 65.0 in | Wt 154.0 lb

## 2021-12-01 DIAGNOSIS — Z1211 Encounter for screening for malignant neoplasm of colon: Secondary | ICD-10-CM | POA: Diagnosis not present

## 2021-12-01 DIAGNOSIS — Z3041 Encounter for surveillance of contraceptive pills: Secondary | ICD-10-CM | POA: Diagnosis not present

## 2021-12-01 DIAGNOSIS — I10 Essential (primary) hypertension: Secondary | ICD-10-CM

## 2021-12-01 DIAGNOSIS — Z01419 Encounter for gynecological examination (general) (routine) without abnormal findings: Secondary | ICD-10-CM | POA: Insufficient documentation

## 2021-12-01 LAB — HEMOCCULT GUIAC POC 1CARD (OFFICE): Fecal Occult Blood, POC: NEGATIVE

## 2021-12-01 MED ORDER — LO LOESTRIN FE 1 MG-10 MCG / 10 MCG PO TABS: 1.0000 | ORAL_TABLET | Freq: Every day | ORAL | 12 refills | Status: AC

## 2021-12-01 MED ORDER — HYDROCHLOROTHIAZIDE 12.5 MG PO CAPS
ORAL_CAPSULE | ORAL | 3 refills | Status: DC
Start: 2021-12-01 — End: 2022-03-06

## 2021-12-01 NOTE — Progress Notes (Signed)
Patient ID: Melissa Skinner, female   DOB: 1975/10/20, 46 y.o.   MRN: [id number] ?History of Present Illness: ?Melissa Skinner is a 46 year old white female, married, [id number], in for a well woman gyn exam and pap. She ran out of OCs and stopped taking Microzide. ?PCP is [name].  ? ? ?Current Medications, Allergies, Past Medical History, Past Surgical History, Family History and Social History were reviewed in [employer] record.   ? ? ?Review of Systems: ?Patient denies any headaches, hearing loss, fatigue, blurred vision, shortness of breath, chest pain, abdominal pain, problems with bowel movements, urination, or intercourse. No joint pain or mood swings.  ?Ran out of OCs, on period ?Had GB out on March ? ? ?Physical Exam:BP (!) 142/92 (BP Location: Right Arm, Patient Position: Sitting, Cuff Size: Normal)   Pulse 73   Ht 5\' 5"  (1.651 m)   Wt 154 lb (69.9 kg)   LMP 11/21/2021   BMI 25.63 kg/m?   ?General:  Well developed, well nourished, no acute distress ?Skin:  Warm and dry ?Neck:  Midline trachea, normal thyroid, good ROM, no lymphadenopathy ?Lungs; Clear to auscultation bilaterally ?Breast:  No dominant palpable mass, retraction, or nipple discharge ?Cardiovascular: Regular rate and rhythm ?Abdomen:  Soft, non tender, no hepatosplenomegaly ?Pelvic:  External genitalia is normal in appearance, no lesions.  The vagina is normal in appearance,+period blood.01/21/2022 Urethra has no lesions or masses. The cervix is bulbous. Pap with HR HPV genotyping performed. Uterus is felt to be normal size, shape, and contour.  No adnexal masses or tenderness noted.Bladder is non tender, no masses felt. ?Rectal: Good sphincter tone, no polyps, or hemorrhoids felt.  Hemoccult negative. ?Extremities/musculoskeletal:  No swelling or varicosities noted, no clubbing or cyanosis ?Psych:  No mood changes, alert and cooperative,seems happy ?AA is 0 ?Fall risk is low ? ?  12/01/2021  ?  1:33 PM 10/08/2019  ?  9:12 AM  ?Depression  screen PHQ 2/9  ?Decreased Interest 0 0  ?Down, Depressed, Hopeless 0 0  ?PHQ - 2 Score 0 0  ?Altered sleeping 0   ?Tired, decreased energy 1   ?Change in appetite 0   ?Feeling bad or failure about yourself  0   ?Trouble concentrating 0   ?Moving slowly or fidgety/restless 0   ?Suicidal thoughts 0   ?PHQ-9 Score 1   ?  ? ?  12/01/2021  ?  1:33 PM  ?GAD 7 : Generalized Anxiety Score  ?Nervous, Anxious, on Edge 0  ?Control/stop worrying 0  ?Worry too much - different things 0  ?Trouble relaxing 0  ?Restless 0  ?Easily annoyed or irritable 0  ?Afraid - awful might happen 0  ?Total GAD 7 Score 0  ? ?  ? Upstream - 12/01/21 1333   ? ?  ? Pregnancy Intention Screening  ? Does the patient want to become pregnant in the next year? No   ? Does the patient's partner want to become pregnant in the next year? No   ? Would the patient like to discuss contraceptive options today? Yes   ?  ? Contraception Wrap Up  ? Current Method Oral Contraceptive   ? End Method Oral Contraceptive   ? Contraception Counseling Provided Yes   ? ?  ?  ? ?  ?  ?Examination chaperoned by 01/31/22 LPN ? ?Impression and Plan: ?1. Encounter for gynecological examination with Papanicolaou smear of cervix ?Pap sent ?Pap in 3 years if normal ?Physical in 1  year with PCP  ?Labs with PCP ?Colonoscopy or cologuard advised talk with PCP ? ? ?2. Encounter for screening fecal occult blood testing ?Hemoccult negative  ? ?3. Hypertension, unspecified type ?Start back taking microzide ?Meds ordered this encounter  ?Medications  ? Norethindrone-Ethinyl Estradiol-Fe Biphas (LO LOESTRIN FE) 1 MG-10 MCG / 10 MCG tablet  ?  Sig: Take 1 tablet by mouth daily.  ?  Dispense:  28 tablet  ?  Refill:  12  ?  Order Specific Question:   Supervising Provider  ?  Answer:   Duane Lope H [[id number]]  ? hydrochlorothiazide (MICROZIDE) 12.5 MG capsule  ?  Sig: TAKE 1 CAPSULE(12.5 MG) BY MOUTH DAILY  ?  Dispense:  90 capsule  ?  Refill:  3  ?  **Patient requests 90 days supply**  ?   Order Specific Question:   Supervising Provider  ?  Answer:   Duane Lope H [[id number]]  ? Follow up in 3 months for BP check and ROS ? ? ?4. Encounter for surveillance of contraceptive pills ?Will refill lo Loestrin, 1 pack given can start today  ? ? ? ?  ?  ?

## 2021-12-06 LAB — CYTOLOGY - PAP
Comment: NEGATIVE
Diagnosis: NEGATIVE
High risk HPV: NEGATIVE

## 2022-03-06 ENCOUNTER — Encounter: Payer: Self-pay | Admitting: Adult Health

## 2022-03-06 ENCOUNTER — Ambulatory Visit (INDEPENDENT_AMBULATORY_CARE_PROVIDER_SITE_OTHER): Payer: BC Managed Care – PPO | Admitting: Adult Health

## 2022-03-06 VITALS — BP 117/81 | HR 87 | Ht 64.5 in | Wt 149.5 lb

## 2022-03-06 DIAGNOSIS — I10 Essential (primary) hypertension: Secondary | ICD-10-CM

## 2022-03-06 DIAGNOSIS — Z3041 Encounter for surveillance of contraceptive pills: Secondary | ICD-10-CM

## 2022-03-06 NOTE — Progress Notes (Signed)
  Subjective:     Patient ID: Melissa Skinner, female   DOB: [birth year], 46 y.o.   MRN: [id number]  HPI Melissa Skinner is a 46 year old white female, married, [id number], in for follow up on BP and OCs, has no complaints.  Lab Results  Component Value Date   DIAGPAP  12/01/2021    - Negative for intraepithelial lesion or malignancy (NILM)   HPVHIGH Negative 12/01/2021   PCP is [name].  Review of Systems Doing good, periods good on lo loestrin Reviewed past medical,surgical, social and family history. Reviewed medications and allergies.     Objective:   Physical Exam BP 117/81 (BP Location: Left Arm, Patient Position: Sitting, Cuff Size: Normal)   Pulse 87   Ht 5' 4.5" (1.638 m)   Wt 149 lb 8 oz (67.8 kg)   BMI 25.27 kg/m     Skin warm and dry. Lungs: clear to ausculation bilaterally. Cardiovascular: regular rate and rhythm.   Upstream - 03/06/22 1518       Pregnancy Intention Screening   Does the patient want to become pregnant in the next year? No    Does the patient's partner want to become pregnant in the next year? No    Would the patient like to discuss contraceptive options today? No      Contraception Wrap Up   Current Method Oral Contraceptive    End Method Oral Contraceptive             Assessment:     1. Hypertension, unspecified type BP is good Continue hydrodiuril 25 mg   2. Encounter for surveillance of contraceptive pills Doing good with lo loestrin has refills    Plan:     Follow up prn

## 2022-08-08 ENCOUNTER — Ambulatory Visit: Payer: BC Managed Care – PPO | Admitting: Neurology

## 2022-08-08 ENCOUNTER — Encounter: Payer: Self-pay | Admitting: Neurology

## 2022-08-08 VITALS — BP 124/81 | HR 86 | Ht 64.0 in | Wt 153.0 lb

## 2022-08-08 DIAGNOSIS — Z5181 Encounter for therapeutic drug level monitoring: Secondary | ICD-10-CM | POA: Diagnosis not present

## 2022-08-08 DIAGNOSIS — G40109 Localization-related (focal) (partial) symptomatic epilepsy and epileptic syndromes with simple partial seizures, not intractable, without status epilepticus: Secondary | ICD-10-CM | POA: Diagnosis not present

## 2022-08-08 MED ORDER — LEVETIRACETAM 1000 MG PO TABS: 1000.0000 mg | ORAL_TABLET | Freq: Two times a day (BID) | ORAL | 3 refills | Status: DC

## 2022-08-08 NOTE — Progress Notes (Signed)
[facility]  PATIENT: Melissa Skinner DOB: [birth year]  REQUESTING CLINICIAN: Phillips Odor, MD HISTORY FROM: Patient and chart review  REASON FOR VISIT: Focal epilepsy/Here to establish care    HISTORICAL  CHIEF COMPLAINT:  Chief Complaint  Patient presents with   New Patient (Initial Visit)    Rm 12, alone  NP Paper referral for Seizure [facility] Reports mild seizure like activity around 07/17/22    HISTORY OF PRESENT ILLNESS:  This is a 47 year old woman past medical history of hypertension, asthma, focal epilepsy who is presenting to establish care.  Patient was previously managed by Dr. Merlene Laughter but since he is retiring, she needs a new neurologist.  In brief she reported her seizures started 3 years ago.  Initially she was having symptoms of feeling tired, could not walk properly.  Sometimes she will get upset for no reason and being very emotional.  She presented to her PMD who referred her to see a neurologist.  At that time she did have a EEG which showed left temporal sharp and left focal slowing.  Her MRI was completed at that time and was normal.  She was started on Keppra, initially 500 mg twice daily.  She reported her symptoms improved at that time.  It went from having these symptoms twice weekly to maybe once a month. On Thanksgiving week she had 1 episode where she could not walk properly, she presented again to the neurologist who increased her Keppra to 1000 mg twice daily and since then she had 1 additional event on Christmas day where she described described being very tired with the flashes of light.  Since then she has not reported any additional symptoms.  She is tolerating the Keppra better.  Initially she did have fatigue and somnolence but stated that currently she is tolerating the medication very well.   Handedness: Right handed   Onset: 3 years ago   Seizure Type: Inability to walk, to focus, confused and  being emotional   Current frequency: Last event Christmas   Any injuries from seizures: No injuries   Seizure risk factors: Concussion 12 years ago  Previous ASMs: Levetiracetam   Currenty ASMs: Levetiracetam   ASMs side effects: Initially tiredness and fatigue which are improving  Brain Images: Normal MRI Brain 2022  Previous EEGs: EEG 2021: Sharp and slowing in the left temporal region    OTHER MEDICAL CONDITIONS: Asthma Hypertension  REVIEW OF SYSTEMS: Full 14 system review of systems performed and negative with exception of: As noted in the HPI   ALLERGIES: Allergies  Allergen Reactions   Molds & Smuts    Other     Cats,rabbits, dust, pollen    HOME MEDICATIONS: Outpatient Medications Prior to Visit  Medication Sig Dispense Refill   albuterol (PROVENTIL HFA;VENTOLIN HFA) 108 (90 BASE) MCG/ACT inhaler Inhale 2 puffs into the lungs every 4 (four) hours as needed. 6.7 g 0   cetirizine (ZYRTEC) 10 MG tablet Take 10 mg by mouth daily.     hydrochlorothiazide (HYDRODIURIL) 25 MG tablet Take 25 mg by mouth daily.     meclizine (ANTIVERT) 25 MG tablet Take 25 mg by mouth daily as needed.     Multiple Vitamins-Minerals (AIRBORNE PO) Take by mouth.     Norethindrone-Ethinyl Estradiol-Fe Biphas (LO LOESTRIN FE) 1 MG-10 MCG / 10 MCG tablet Take 1 tablet by mouth daily. 28 tablet 12   Probiotic Product (PROBIOTIC-10 PO) Take by mouth.     levETIRAcetam (KEPPRA)  1000 MG tablet Take 1,500 mg by mouth daily.     No facility-administered medications prior to visit.    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    PAST SURGICAL HISTORY: Past Surgical History:  Procedure Laterality Date   CHOLECYSTECTOMY N/A 10/10/2021   Procedure: LAPAROSCOPIC CHOLECYSTECTOMY;  Surgeon: Virl Cagey, MD;  Location: [facility];  Service: General;   Laterality: N/A;   heart catherization     PDA coil  2002   TONSILLECTOMY      FAMILY HISTORY: Family History  Problem Relation Age of Onset   Thyroid disease Mother    Macular degeneration Mother    Diabetes type I Father    Arthritis Father    Uterine cancer Cousin    Breast cancer Maternal Aunt     SOCIAL HISTORY: Social History   Socioeconomic History   Marital status: Married    Spouse name: Not on file   Number of children: 2   Years of education: Not on file   Highest education level: Not on file  Occupational History   Occupation: Pharmacist, hospital at [employer]   Tobacco Use   Smoking status: Never   Smokeless tobacco: Never  Vaping Use   Vaping Use: Never used  Substance and Sexual Activity   Alcohol use: No   Drug use: Never   Sexual activity: Yes    Birth control/protection: Pill  Other Topics Concern   Not on file  Social History Narrative   Not on file   Social Determinants of Health   Financial Resource Strain: Low Risk  (12/01/2021)   Overall Financial Resource Strain (CARDIA)    Difficulty of Paying Living Expenses: Not hard at all  Food Insecurity: No Food Insecurity (12/01/2021)   Hunger Vital Sign    Worried About Running Out of Food in the Last Year: Never true    Ran Out of Food in the Last Year: Never true  Transportation Needs: No Transportation Needs (12/01/2021)   PRAPARE - Hydrologist (Medical): No    Lack of Transportation (Non-Medical): No  Physical Activity: Inactive (12/01/2021)   Exercise Vital Sign    Days of Exercise per Week: 0 days    Minutes of Exercise per Session: 0 min  Stress: No Stress Concern Present (12/01/2021)   [place]    Feeling of Stress : Not at all  Social Connections: Moderately Integrated (12/01/2021)   Social Connection and Isolation Panel [NHANES]    Frequency of Communication with Friends and Family: Once a  week    Frequency of Social Gatherings with Friends and Family: Once a week    Attends Religious Services: More than 4 times per year    Active Member of [employer] or Organizations: Yes    Attends Archivist Meetings: More than 4 times per year    Marital Status: Married  Human resources officer Violence: Not At Risk (12/01/2021)   Humiliation, Afraid, Rape, and Kick questionnaire    Fear of Current or Ex-Partner: No    Emotionally Abused: No    Physically Abused: No    Sexually Abused: No    PHYSICAL EXAM  GENERAL EXAM/CONSTITUTIONAL: Vitals:  Vitals:   08/08/22 0820  BP: 124/81  Pulse: 86  Weight: 153 lb (  69.4 kg)  Height: 5\' 4"  (1.626 m)   Body mass index is 26.26 kg/m. Wt Readings from Last 3 Encounters:  08/08/22 153 lb (69.4 kg)  03/06/22 149 lb 8 oz (67.8 kg)  12/01/21 154 lb (69.9 kg)   Patient is in no distress; well developed, nourished and groomed; neck is supple  EYES: Visual fields full to confrontation, Extraocular movements intacts,  No results found.  MUSCULOSKELETAL: Gait, strength, tone, movements noted in Neurologic exam below  NEUROLOGIC: MENTAL STATUS:      No data to display         awake, alert, oriented to person, place and time recent and remote memory intact normal attention and concentration language fluent, comprehension intact, naming intact fund of knowledge appropriate  CRANIAL NERVE:  2nd, 3rd, 4th, 6th - Visual fields full to confrontation, extraocular muscles intact, no nystagmus 5th - facial sensation symmetric 7th - facial strength symmetric 8th - hearing intact 9th - palate elevates symmetrically, uvula midline 11th - shoulder shrug symmetric 12th - tongue protrusion midline  MOTOR:  normal bulk and tone, full strength in the BUE, BLE  SENSORY:  normal and symmetric to light touch  COORDINATION:  finger-nose-finger, fine finger movements normal  REFLEXES:  deep tendon reflexes present and  symmetric  GAIT/STATION:  normal     DIAGNOSTIC DATA (LABS, IMAGING, TESTING) - I reviewed patient records, labs, notes, testing and imaging myself where available.  Lab Results  Component Value Date   WBC 7.2 10/10/2021   HGB 10.8 (L) 10/10/2021   HCT 33.0 (L) 10/10/2021   MCV 91.9 10/10/2021   PLT 285 10/10/2021      Component Value Date/Time   NA 140 10/10/2021 0428   K 3.3 (L) 10/10/2021 0428   CL 109 10/10/2021 0428   CO2 23 10/10/2021 0428   GLUCOSE 99 10/10/2021 0428   BUN 6 10/10/2021 0428   CREATININE 0.71 10/10/2021 0428   CALCIUM 7.5 (L) 10/10/2021 0428   PROT 5.7 (L) 10/10/2021 0428   ALBUMIN 2.7 (L) 10/10/2021 0428   AST 13 (L) 10/10/2021 0428   ALT 13 10/10/2021 0428   ALKPHOS 36 (L) 10/10/2021 0428   BILITOT 0.4 10/10/2021 0428   GFRNONAA >60 10/10/2021 0428   No results found for: "CHOL", "HDL", "LDLCALC", "LDLDIRECT", "TRIG" No results found for: "HGBA1C" No results found for: "VITAMINB12" Lab Results  Component Value Date   TSH 2.588 10/09/2021    MRI Brain 02/24/2021 Unremarkable MRI appearance of the brain. No evidence of acute intracranial abnormality.  I personally reviewed brain Images and previous EEG reports.   ASSESSMENT AND PLAN  47 y.o. year old female  with history of focal epilepsy, hypertension, asthma who is presenting to establish care.  In terms of the seizures, they started 3 years ago, they are under control, she is on Keppra 1000 mg twice daily and overall she is doing well.  At this time we will obtain a Keppra level, vitamin D level and continue patient on Keppra 1000 mg twice daily.  I will see her in 6 months for follow-up or sooner if worse.   1. Focal epilepsy (HCC)   2. Therapeutic drug monitoring     Patient Instructions  Continue with Keppra 1000 mg twice daily Will check a Keppra level with vitamin D Continue to follow with PCP Return in 6 months or sooner if worse.   Per [facility] statutes,  patients with seizures are not allowed to drive until they have  been seizure-free for six months.  Other recommendations include using caution when using heavy equipment or power tools. Avoid working on ladders or at heights. Take showers instead of baths.  Do not swim alone.  Ensure the water temperature is not too high on the home water heater. Do not go swimming alone. Do not lock yourself in a room alone (i.e. bathroom). When caring for infants or small children, sit down when holding, feeding, or changing them to minimize risk of injury to the child in the event you have a seizure. Maintain good sleep hygiene. Avoid alcohol.  Also recommend adequate sleep, hydration, good diet and minimize stress.   During the Seizure  - First, ensure adequate ventilation and place patients on the floor on their left side  Loosen clothing around the neck and ensure the airway is patent. If the patient is clenching the teeth, do not force the mouth open with any object as this can cause severe damage - Remove all items from the surrounding that can be hazardous. The patient may be oblivious to what's happening and may not even know what he or she is doing. If the patient is confused and wandering, either gently guide him/her away and block access to outside areas - Reassure the individual and be comforting - Call 911. In most cases, the seizure ends before EMS arrives. However, there are cases when seizures may last over 3 to 5 minutes. Or the individual may have developed breathing difficulties or severe injuries. If a pregnant patient or a person with diabetes develops a seizure, it is prudent to call an ambulance. - Finally, if the patient does not regain full consciousness, then call EMS. Most patients will remain confused for about 45 to 90 minutes after a seizure, so you must use judgment in calling for help. - Avoid restraints but make sure the patient is in a bed with padded side rails - Place the individual  in a lateral position with the neck slightly flexed; this will help the saliva drain from the mouth and prevent the tongue from falling backward - Remove all nearby furniture and other hazards from the area - Provide verbal assurance as the individual is regaining consciousness - Provide the patient with privacy if possible - Call for help and start treatment as ordered by the caregiver   After the Seizure (Postictal Stage)  After a seizure, most patients experience confusion, fatigue, muscle pain and/or a headache. Thus, one should permit the individual to sleep. For the next few days, reassurance is essential. Being calm and helping reorient the person is also of importance.  Most seizures are painless and end spontaneously. Seizures are not harmful to others but can lead to complications such as stress on the lungs, brain and the heart. Individuals with prior lung problems may develop labored breathing and respiratory distress.     Orders Placed This Encounter  Procedures   Levetiracetam level   Vitamin D, 25-hydroxy    Meds ordered this encounter  Medications   levETIRAcetam (KEPPRA) 1000 MG tablet    Sig: Take 1 tablet (1,000 mg total) by mouth 2 (two) times daily.    Dispense:  180 tablet    Refill:  3    Return in about 6 months (around 02/06/2023).    Windell Norfolk, MD 08/08/2022, 11:04 AM  [facility] [address], [address] [place], [place] [address] [phone]

## 2022-08-08 NOTE — Patient Instructions (Signed)
Continue with Keppra 1000 mg twice daily Will check a Keppra level with vitamin D Continue to follow with PCP Return in 6 months or sooner if worse.

## 2022-08-10 LAB — VITAMIN D 25 HYDROXY (VIT D DEFICIENCY, FRACTURES): Vit D, 25-Hydroxy: 48 ng/mL (ref 30.0–100.0)

## 2022-08-10 LAB — LEVETIRACETAM LEVEL: Levetiracetam Lvl: 44 ug/mL — ABNORMAL HIGH (ref 10.0–40.0)

## 2023-02-06 ENCOUNTER — Ambulatory Visit: Payer: BC Managed Care – PPO | Admitting: Neurology

## 2023-02-06 ENCOUNTER — Encounter: Payer: Self-pay | Admitting: Neurology

## 2023-02-06 VITALS — BP 100/60 | HR 86 | Ht 64.0 in | Wt 157.0 lb

## 2023-02-06 DIAGNOSIS — G40109 Localization-related (focal) (partial) symptomatic epilepsy and epileptic syndromes with simple partial seizures, not intractable, without status epilepticus: Secondary | ICD-10-CM | POA: Diagnosis not present

## 2023-02-06 MED ORDER — LEVETIRACETAM 1000 MG PO TABS: 1000.0000 mg | ORAL_TABLET | Freq: Two times a day (BID) | ORAL | 3 refills | Status: DC

## 2023-02-06 NOTE — Patient Instructions (Signed)
Continue with Keppra 1000 mg twice daily Continue your other medications Follow-up in 1 year or sooner if worse, can follow-up with NP.

## 2023-02-06 NOTE — Progress Notes (Signed)
[facility]  PATIENT: Melissa Skinner DOB: 02-Aug-1975  REQUESTING CLINICIAN: Avis Epley, PA* HISTORY FROM: Patient and chart review  REASON FOR VISIT: Focal epilepsy/Here to establish care    HISTORICAL  CHIEF COMPLAINT:  Chief Complaint  Patient presents with   Follow-up    Rm 12, alone, some auras but no seizures, denies SI/HI/mood changes   INTERVAL HISTORY 02/06/2023:  Patient presents today for follow-up, she is alone.  Last visit was in January and since then she has been doing well, no seizure or seizure activity.  She denies any side effect with the medication.  She is compliant with the Keppra 1000 mg twice daily.  Her last level was normal at 44.  Currently no complaints, no concerns and no other questions.   HISTORY OF PRESENT ILLNESS:  This is a 47 year old woman past medical history of hypertension, asthma, focal epilepsy who is presenting to establish care.  Patient was previously managed by Dr. Gerilyn Pilgrim but since he is retiring, she needs a new neurologist.  In brief she reported her seizures started 3 years ago.  Initially she was having symptoms of feeling tired, could not walk properly.  Sometimes she will get upset for no reason and being very emotional.  She presented to her PMD who referred her to see a neurologist.  At that time she did have a EEG which showed left temporal sharp and left focal slowing.  Her MRI was completed at that time and was normal.  She was started on Keppra, initially 500 mg twice daily.  She reported her symptoms improved at that time.  It went from having these symptoms twice weekly to maybe once a month. On Thanksgiving week she had 1 episode where she could not walk properly, she presented again to the neurologist who increased her Keppra to 1000 mg twice daily and since then she had 1 additional event on Christmas day where she described described being very tired with the flashes of light.  Since then she has not  reported any additional symptoms.  She is tolerating the Keppra better.  Initially she did have fatigue and somnolence but stated that currently she is tolerating the medication very well.   Handedness: Right handed   Onset: 3 years ago   Seizure Type: Inability to walk, to focus, confused and being emotional   Current frequency: Last event Christmas   Any injuries from seizures: No injuries   Seizure risk factors: Concussion 12 years ago  Previous ASMs: Levetiracetam   Currenty ASMs: Levetiracetam 1000 mg BID   ASMs side effects: Initially tiredness and fatigue which are improving  Brain Images: Normal MRI Brain 2022  Previous EEGs: EEG 2021: Sharp and slowing in the left temporal region    OTHER MEDICAL CONDITIONS: Asthma Hypertension  REVIEW OF SYSTEMS: Full 14 system review of systems performed and negative with exception of: As noted in the HPI   ALLERGIES: Allergies  Allergen Reactions   Molds & Smuts    Other     Cats,rabbits, dust, pollen    HOME MEDICATIONS: Outpatient Medications Prior to Visit  Medication Sig Dispense Refill   albuterol (PROVENTIL HFA;VENTOLIN HFA) 108 (90 BASE) MCG/ACT inhaler Inhale 2 puffs into the lungs every 4 (four) hours as needed. 6.7 g 0   cetirizine (ZYRTEC) 10 MG tablet Take 10 mg by mouth daily.     hydrochlorothiazide (HYDRODIURIL) 25 MG tablet Take 25 mg by mouth daily.     meclizine (ANTIVERT) 25 MG  tablet Take 25 mg by mouth daily as needed.     Multiple Vitamins-Minerals (AIRBORNE PO) Take by mouth.     Norethindrone-Ethinyl Estradiol-Fe Biphas (LO LOESTRIN FE) 1 MG-10 MCG / 10 MCG tablet Take 1 tablet by mouth daily. 28 tablet 12   Probiotic Product (PROBIOTIC-10 PO) Take by mouth.     levETIRAcetam (KEPPRA) 1000 MG tablet Take 1 tablet (1,000 mg total) by mouth 2 (two) times daily. 180 tablet 3   No facility-administered medications prior to visit.    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    PAST SURGICAL HISTORY: Past Surgical History:  Procedure Laterality Date   CHOLECYSTECTOMY N/A 10/10/2021   Procedure: LAPAROSCOPIC CHOLECYSTECTOMY;  Surgeon: Lucretia Roers, MD;  Location: [facility];  Service: General;  Laterality: N/A;   heart catherization     PDA coil  2002   TONSILLECTOMY      FAMILY HISTORY: Family History  Problem Relation Age of Onset   Thyroid disease Mother    Macular degeneration Mother    Diabetes type I Father    Arthritis Father    Uterine cancer Cousin    Breast cancer Maternal Aunt     SOCIAL HISTORY: Social History   Socioeconomic History   Marital status: Married    Spouse name: Not on file   Number of children: 2   Years of education: Not on file   Highest education level: Not on file  Occupational History   Occupation: Runner, broadcasting/film/video at [employer]   Tobacco Use   Smoking status: Never   Smokeless tobacco: Never  Vaping Use   Vaping status: Never Used  Substance and Sexual Activity   Alcohol use: No   Drug use: Never   Sexual activity: Yes    Birth control/protection: Pill  Other Topics Concern   Not on file  Social History Narrative   Right handed   [place] with husband and 2 kids   Caffeine 1-2 cup daily   Social Determinants of Health   Financial Resource Strain: Low Risk  (12/01/2021)   Overall Financial Resource Strain (CARDIA)    Difficulty of Paying Living Expenses: Not hard at all  Food Insecurity: No Food Insecurity (12/01/2021)   Hunger Vital Sign    Worried About Running Out of Food in the Last Year: Never true    Ran Out of Food in the Last Year: Never true  Transportation Needs: No Transportation Needs (12/01/2021)   PRAPARE - Administrator, Civil Service (Medical): No    Lack of Transportation (Non-Medical): No  Physical Activity: Inactive (12/01/2021)   Exercise  Vital Sign    Days of Exercise per Week: 0 days    Minutes of Exercise per Session: 0 min  Stress: No Stress Concern Present (12/01/2021)   [employer] of Occupational Health - Occupational Stress Questionnaire    Feeling of Stress : Not at all  Social Connections: Moderately Integrated (12/01/2021)   Social Connection and Isolation Panel [NHANES]    Frequency of Communication with Friends and Family: Once a week    Frequency of Social Gatherings with Friends and Family: Once a week    Attends Religious Services: More than 4 times per year    Active Member of [employer] or Organizations: Yes    Attends Banker  Meetings: More than 4 times per year    Marital Status: Married  Catering manager Violence: Not At Risk (12/01/2021)   Humiliation, Afraid, Rape, and Kick questionnaire    Fear of Current or Ex-Partner: No    Emotionally Abused: No    Physically Abused: No    Sexually Abused: No    PHYSICAL EXAM  GENERAL EXAM/CONSTITUTIONAL: Vitals:  Vitals:   02/06/23 0914  BP: 100/60  Pulse: 86  SpO2: 97%  Weight: 157 lb (71.2 kg)  Height: 5\' 4"  (1.626 m)   Body mass index is 26.95 kg/m. Wt Readings from Last 3 Encounters:  02/06/23 157 lb (71.2 kg)  08/08/22 153 lb (69.4 kg)  03/06/22 149 lb 8 oz (67.8 kg)   Patient is in no distress; well developed, nourished and groomed; neck is supple  MUSCULOSKELETAL: Gait, strength, tone, movements noted in Neurologic exam below  NEUROLOGIC: MENTAL STATUS:      No data to display         awake, alert, oriented to person, place and time recent and remote memory intact normal attention and concentration language fluent, comprehension intact, naming intact fund of knowledge appropriate  CRANIAL NERVE:  2nd, 3rd, 4th, 6th - Visual fields full to confrontation, extraocular muscles intact, no nystagmus 5th - facial sensation symmetric 7th - facial strength symmetric 8th - hearing intact 9th - palate elevates  symmetrically, uvula midline 11th - shoulder shrug symmetric 12th - tongue protrusion midline  MOTOR:  normal bulk and tone, full strength in the BUE, BLE  SENSORY:  normal and symmetric to light touch  COORDINATION:  finger-nose-finger, fine finger movements normal  REFLEXES:  deep tendon reflexes present and symmetric  GAIT/STATION:  normal     DIAGNOSTIC DATA (LABS, IMAGING, TESTING) - I reviewed patient records, labs, notes, testing and imaging myself where available.  Lab Results  Component Value Date   WBC 7.2 10/10/2021   HGB 10.8 (L) 10/10/2021   HCT 33.0 (L) 10/10/2021   MCV 91.9 10/10/2021   PLT 285 10/10/2021      Component Value Date/Time   NA 140 10/10/2021 0428   K 3.3 (L) 10/10/2021 0428   CL 109 10/10/2021 0428   CO2 23 10/10/2021 0428   GLUCOSE 99 10/10/2021 0428   BUN 6 10/10/2021 0428   CREATININE 0.71 10/10/2021 0428   CALCIUM 7.5 (L) 10/10/2021 0428   PROT 5.7 (L) 10/10/2021 0428   ALBUMIN 2.7 (L) 10/10/2021 0428   AST 13 (L) 10/10/2021 0428   ALT 13 10/10/2021 0428   ALKPHOS 36 (L) 10/10/2021 0428   BILITOT 0.4 10/10/2021 0428   GFRNONAA >60 10/10/2021 0428   No results found for: "CHOL", "HDL", "LDLCALC", "LDLDIRECT", "TRIG" No results found for: "HGBA1C" No results found for: "VITAMINB12" Lab Results  Component Value Date   TSH 2.588 10/09/2021    MRI Brain 02/24/2021 Unremarkable MRI appearance of the brain. No evidence of acute intracranial abnormality.  I personally reviewed brain Images and previous EEG reports.   ASSESSMENT AND PLAN  47 y.o. year old female  with history of focal epilepsy, hypertension, asthma who is presenting for follow up.  She is doing well on Keppra 1000 mg twice daily, no side effect no seizure urgency or seizure activity.  Plan is for patient to continue with Keppra 1000 mg twice daily and I will see her in 1 year for follow-up or sooner if worse.    1. Focal epilepsy [facility])      Patient  Instructions  Continue with Keppra 1000 mg twice daily Continue your other medications Follow-up in 1 year or sooner if worse, can follow-up with NP.   Per [facility] statutes, patients with seizures are not allowed to drive until they have been seizure-free for six months.  Other recommendations include using caution when using heavy equipment or power tools. Avoid working on ladders or at heights. Take showers instead of baths.  Do not swim alone.  Ensure the water temperature is not too high on the home water heater. Do not go swimming alone. Do not lock yourself in a room alone (i.e. bathroom). When caring for infants or small children, sit down when holding, feeding, or changing them to minimize risk of injury to the child in the event you have a seizure. Maintain good sleep hygiene. Avoid alcohol.  Also recommend adequate sleep, hydration, good diet and minimize stress.   During the Seizure  - First, ensure adequate ventilation and place patients on the floor on their left side  Loosen clothing around the neck and ensure the airway is patent. If the patient is clenching the teeth, do not force the mouth open with any object as this can cause severe damage - Remove all items from the surrounding that can be hazardous. The patient may be oblivious to what's happening and may not even know what he or she is doing. If the patient is confused and wandering, either gently guide him/her away and block access to outside areas - Reassure the individual and be comforting - Call 911. In most cases, the seizure ends before EMS arrives. However, there are cases when seizures may last over 3 to 5 minutes. Or the individual may have developed breathing difficulties or severe injuries. If a pregnant patient or a person with diabetes develops a seizure, it is prudent to call an ambulance. - Finally, if the patient does not regain full consciousness, then call EMS. Most patients will remain confused for  about 45 to 90 minutes after a seizure, so you must use judgment in calling for help. - Avoid restraints but make sure the patient is in a bed with padded side rails - Place the individual in a lateral position with the neck slightly flexed; this will help the saliva drain from the mouth and prevent the tongue from falling backward - Remove all nearby furniture and other hazards from the area - Provide verbal assurance as the individual is regaining consciousness - Provide the patient with privacy if possible - Call for help and start treatment as ordered by the caregiver   After the Seizure (Postictal Stage)  After a seizure, most patients experience confusion, fatigue, muscle pain and/or a headache. Thus, one should permit the individual to sleep. For the next few days, reassurance is essential. Being calm and helping reorient the person is also of importance.  Most seizures are painless and end spontaneously. Seizures are not harmful to others but can lead to complications such as stress on the lungs, brain and the heart. Individuals with prior lung problems may develop labored breathing and respiratory distress.     No orders of the defined types were placed in this encounter.   Meds ordered this encounter  Medications   levETIRAcetam (KEPPRA) 1000 MG tablet    Sig: Take 1 tablet (1,000 mg total) by mouth 2 (two) times daily.    Dispense:  180 tablet    Refill:  3    Return in about 1 year (around 02/06/2024).  Windell Norfolk, MD 02/06/2023, 9:37 AM  [facility] [address] [place], [place] [address] [phone]

## 2024-02-13 ENCOUNTER — Encounter: Payer: Self-pay | Admitting: Adult Health

## 2024-02-13 ENCOUNTER — Ambulatory Visit: Payer: BC Managed Care – PPO | Admitting: Adult Health

## 2024-02-13 VITALS — BP 119/80 | HR 83 | Ht 65.0 in | Wt 163.0 lb

## 2024-02-13 DIAGNOSIS — G40109 Localization-related (focal) (partial) symptomatic epilepsy and epileptic syndromes with simple partial seizures, not intractable, without status epilepticus: Secondary | ICD-10-CM | POA: Diagnosis not present

## 2024-02-13 MED ORDER — LEVETIRACETAM 1000 MG PO TABS: 1000.0000 mg | ORAL_TABLET | Freq: Two times a day (BID) | ORAL | 3 refills | Status: AC

## 2024-02-13 NOTE — Progress Notes (Signed)
 [facility]  PATIENT: Melissa Skinner DOB: [birth year]  REQUESTING CLINICIAN: Leonce Lucie PARAS, PA* HISTORY FROM: Patient and chart review  REASON FOR VISIT: Focal epilepsy   HISTORICAL  CHIEF COMPLAINT:  Chief Complaint  Patient presents with   Seizures    Rm 3 alone Pt is well and stable, reports no known sz since last visit.     HPI:  INTERVAL HISTORY 02/13/2024 JM: Patient returns for yearly seizure follow-up.  Continues to do well without any seizure activity.  Reports compliance on Keppra  1000 mg twice daily without side effects.  She has a follow-up with her PCP next month with plans on repeat lab work.  She has no questions or concerns at this time.     INTERVAL HISTORY 02/06/2023 Dr. Gregg:  Patient presents today for follow-up, she is alone.  Last visit was in January and since then she has been doing well, no seizure or seizure activity.  She denies any side effect with the medication.  She is compliant with the Keppra  1000 mg twice daily.  Her last level was normal at 44.  Currently no complaints, no concerns and no other questions.   CONSULT VISIT 08/08/2022 Dr. Gregg: This is a 48 year old woman past medical history of hypertension, asthma, focal epilepsy who is presenting to establish care.  Patient was previously managed by Dr. Milton but since he is retiring, she needs a new neurologist.  In brief she reported her seizures started 3 years ago.  Initially she was having symptoms of feeling tired, could not walk properly.  Sometimes she will get upset for no reason and being very emotional.  She presented to her PMD who referred her to see a neurologist.  At that time she did have a EEG which showed left temporal sharp and left focal slowing.  Her MRI was completed at that time and was normal.  She was started on Keppra , initially 500 mg twice daily.  She reported her symptoms improved at that time.  It went from having these symptoms twice weekly  to maybe once a month. On Thanksgiving week she had 1 episode where she could not walk properly, she presented again to the neurologist who increased her Keppra  to 1000 mg twice daily and since then she had 1 additional event on Christmas day where she described described being very tired with the flashes of light.  Since then she has not reported any additional symptoms.  She is tolerating the Keppra  better.  Initially she did have fatigue and somnolence but stated that currently she is tolerating the medication very well.   Handedness: Right handed   Onset: 3 years ago   Seizure Type: Inability to walk, to focus, confused and being emotional   Current frequency: Last event Christmas   Any injuries from seizures: No injuries   Seizure risk factors: Concussion 12 years ago  Previous ASMs: Levetiracetam    Currenty ASMs: Levetiracetam  1000 mg BID   ASMs side effects: Initially tiredness and fatigue which are improving  Brain Images: Normal MRI Brain 2022  Previous EEGs: EEG 2021: Sharp and slowing in the left temporal region    OTHER MEDICAL CONDITIONS: Asthma Hypertension  REVIEW OF SYSTEMS: Full 14 system review of systems performed and negative with exception of: As noted in the HPI   ALLERGIES: Allergies  Allergen Reactions   Molds & Smuts    Other     Cats,rabbits, dust, pollen    HOME MEDICATIONS: Outpatient Medications Prior to Visit  Medication Sig Dispense Refill   albuterol  (PROVENTIL  HFA;VENTOLIN  HFA) 108 (90 BASE) MCG/ACT inhaler Inhale 2 puffs into the lungs every 4 (four) hours as needed. 6.7 g 0   cetirizine (ZYRTEC) 10 MG tablet Take 10 mg by mouth daily.     hydrochlorothiazide  (HYDRODIURIL ) 25 MG tablet Take 25 mg by mouth daily.     levETIRAcetam  (KEPPRA ) 1000 MG tablet Take 1 tablet (1,000 mg total) by mouth 2 (two) times daily. 180 tablet 3   Multiple Vitamins-Minerals (AIRBORNE PO) Take by mouth.     Norethindrone-Ethinyl Estradiol-Fe Biphas (LO  LOESTRIN  FE) 1 MG-10 MCG / 10 MCG tablet Take 1 tablet by mouth daily. 28 tablet 12   Probiotic Product (PROBIOTIC-10 PO) Take by mouth.     meclizine (ANTIVERT) 25 MG tablet Take 25 mg by mouth daily as needed. (Patient not taking: Reported on 02/13/2024)     No facility-administered medications prior to visit.    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    PAST SURGICAL HISTORY: Past Surgical History:  Procedure Laterality Date   CHOLECYSTECTOMY N/A 10/10/2021   Procedure: LAPAROSCOPIC CHOLECYSTECTOMY;  Surgeon: Kallie Manuelita BROCKS, MD;  Location: [facility];  Service: General;  Laterality: N/A;   heart catherization     PDA coil  2002   TONSILLECTOMY      FAMILY HISTORY: Family History  Problem Relation Age of Onset   Thyroid  disease Mother    Macular degeneration Mother    Diabetes type I Father    Arthritis Father    Uterine cancer Cousin    Breast cancer Maternal Aunt     SOCIAL HISTORY: Social History   Socioeconomic History   Marital status: Married    Spouse name: Not on file   Number of children: 2   Years of education: Not on file   Highest education level: Not on file  Occupational History   Occupation: Runner, broadcasting/film/video at [employer]   Tobacco Use   Smoking status: Never   Smokeless tobacco: Never  Vaping Use   Vaping status: Never Used  Substance and Sexual Activity   Alcohol use: No   Drug use: Never   Sexual activity: Yes    Birth control/protection: Pill  Other Topics Concern   Not on file  Social History Narrative   Right handed   [place] with husband and 2 kids   Caffeine 1-2 cup daily   Social Drivers of Health   Financial Resource Strain: Low Risk  (12/01/2021)   Overall Financial Resource Strain (CARDIA)    Difficulty of Paying Living Expenses: Not hard at all  Food Insecurity: No Food  Insecurity (12/01/2021)   Hunger Vital Sign    Worried About Running Out of Food in the Last Year: Never true    Ran Out of Food in the Last Year: Never true  Transportation Needs: No Transportation Needs (12/01/2021)   PRAPARE - Administrator, Civil Service (Medical): No    Lack of Transportation (Non-Medical): No  Physical Activity: Inactive (12/01/2021)   Exercise Vital Sign    Days of Exercise per Week: 0 days    Minutes of Exercise per Session: 0 min  Stress: No Stress Concern Present (12/01/2021)   [employer] of Occupational Health - Occupational Stress Questionnaire    Feeling of Stress : Not  at all  Social Connections: Moderately Integrated (12/01/2021)   Social Connection and Isolation Panel    Frequency of Communication with Friends and Family: Once a week    Frequency of Social Gatherings with Friends and Family: Once a week    Attends Religious Services: More than 4 times per year    Active Member of [employer] or Organizations: Yes    Attends Engineer, structural: More than 4 times per year    Marital Status: Married  Catering manager Violence: Not At Risk (12/01/2021)   Humiliation, Afraid, Rape, and Kick questionnaire    Fear of Current or Ex-Partner: No    Emotionally Abused: No    Physically Abused: No    Sexually Abused: No    PHYSICAL EXAM  GENERAL EXAM/CONSTITUTIONAL: Vitals:  Vitals:   02/13/24 1313  BP: 119/80  Pulse: 83  Weight: 163 lb (73.9 kg)  Height: 5' 5 (1.651 m)    Body mass index is 27.12 kg/m. Wt Readings from Last 3 Encounters:  02/13/24 163 lb (73.9 kg)  02/06/23 157 lb (71.2 kg)  08/08/22 153 lb (69.4 kg)   Patient is in no distress; well developed, nourished and groomed; neck is supple  MUSCULOSKELETAL: Gait, strength, tone, movements noted in Neurologic exam below  NEUROLOGIC: MENTAL STATUS:  awake, alert, oriented to person, place and time recent and remote memory intact normal attention and  concentration language fluent, comprehension intact, naming intact fund of knowledge appropriate  CRANIAL NERVE:  2nd, 3rd, 4th, 6th - Visual fields full to confrontation, extraocular muscles intact, no nystagmus 5th - facial sensation symmetric 7th - facial strength symmetric 8th - hearing intact 9th - palate elevates symmetrically, uvula midline 11th - shoulder shrug symmetric 12th - tongue protrusion midline  MOTOR:  normal bulk and tone, full strength in the BUE, BLE  SENSORY:  normal and symmetric to light touch  COORDINATION:  finger-nose-finger, fine finger movements normal  REFLEXES:  deep tendon reflexes present and symmetric  GAIT/STATION:  normal     DIAGNOSTIC DATA (LABS, IMAGING, TESTING) - I reviewed patient records, labs, notes, testing and imaging myself where available.  Lab Results  Component Value Date   WBC 7.2 10/10/2021   HGB 10.8 (L) 10/10/2021   HCT 33.0 (L) 10/10/2021   MCV 91.9 10/10/2021   PLT 285 10/10/2021      Component Value Date/Time   NA 140 10/10/2021 0428   K 3.3 (L) 10/10/2021 0428   CL 109 10/10/2021 0428   CO2 23 10/10/2021 0428   GLUCOSE 99 10/10/2021 0428   BUN 6 10/10/2021 0428   CREATININE 0.71 10/10/2021 0428   CALCIUM 7.5 (L) 10/10/2021 0428   PROT 5.7 (L) 10/10/2021 0428   ALBUMIN 2.7 (L) 10/10/2021 0428   AST 13 (L) 10/10/2021 0428   ALT 13 10/10/2021 0428   ALKPHOS 36 (L) 10/10/2021 0428   BILITOT 0.4 10/10/2021 0428   GFRNONAA >60 10/10/2021 0428   No results found for: CHOL, HDL, LDLCALC, LDLDIRECT, TRIG No results found for: HGBA1C No results found for: VITAMINB12 Lab Results  Component Value Date   TSH 2.588 10/09/2021    MRI Brain 02/24/2021 Unremarkable MRI appearance of the brain. No evidence of acute intracranial abnormality.  I personally reviewed brain Images and previous EEG reports.     ASSESSMENT AND PLAN  48 y.o. year old female  with history of focal epilepsy,  hypertension, asthma who is returns for follow up for seizure management. Onset 2020,  last  event 06/2022.    Focal epilepsy No additional seizure activity Continue Keppra  1000 mg twice daily -refill provided Advised to call with any recurrent seizure activity Discussed importance of avoiding seizure triggers    Follow-up in 1 year via MyChart video visit or call earlier if needed   I personally spent a total of 15 minutes in the care of the patient today including preparing to see the patient, performing a medically appropriate exam/evaluation, counseling and educating, placing orders, and documenting clinical information in the EHR.  Harlene Bogaert, AGNP-BC  [facility] [address] [place], [place] [address]  Phone [phone] Fax [phone] Note: This document was prepared with digital dictation and possible smart phrase technology. Any transcriptional errors that result from this process are unintentional.

## 2024-02-13 NOTE — Patient Instructions (Signed)
 Your Plan:  Continue Keppra  1000 mg twice daily for seizure prevention  Please call with any recurrent seizure activity     Follow-up in 1 year or call earlier if needed     Thank you for coming to see us  at [facility]. I hope we have been able to provide you high quality care today.  You may receive a patient satisfaction survey over the next few weeks. We would appreciate your feedback and comments so that we may continue to improve ourselves and the health of our patients.

## 2025-02-12 ENCOUNTER — Telehealth: Admitting: Adult Health
